# Patient Record
Sex: Female | Born: 1995 | Race: White | Hispanic: No | Marital: Married | Smoking: Never smoker
Health system: Southern US, Community
[De-identification: ages and names within clinical notes are randomized; demographics above are authoritative.]

## PROBLEM LIST (undated history)

## (undated) DIAGNOSIS — E119 Type 2 diabetes mellitus without complications: Secondary | ICD-10-CM

## (undated) DIAGNOSIS — B9689 Other specified bacterial agents as the cause of diseases classified elsewhere: Secondary | ICD-10-CM

## (undated) DIAGNOSIS — N200 Calculus of kidney: Secondary | ICD-10-CM

## (undated) HISTORY — PX: OTHER SURGICAL HISTORY: SHX169

---
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
## 2014-08-10 ENCOUNTER — Other Ambulatory Visit (HOSPITAL_COMMUNITY): Payer: Self-pay | Admitting: Family Medicine

## 2014-08-10 DIAGNOSIS — R1011 Right upper quadrant pain: Secondary | ICD-10-CM

## 2014-08-14 ENCOUNTER — Ambulatory Visit (HOSPITAL_COMMUNITY): Payer: BLUE CROSS/BLUE SHIELD

## 2014-08-18 ENCOUNTER — Other Ambulatory Visit (HOSPITAL_COMMUNITY): Payer: Self-pay | Admitting: Family Medicine

## 2014-08-18 ENCOUNTER — Ambulatory Visit (HOSPITAL_COMMUNITY)
Admission: RE | Admit: 2014-08-18 | Discharge: 2014-08-18 | Disposition: A | Discharge status: Home / Self Care | Payer: BLUE CROSS/BLUE SHIELD | Source: Ambulatory Visit | Attending: Family Medicine | Admitting: Family Medicine

## 2014-08-18 DIAGNOSIS — R1011 Right upper quadrant pain: Secondary | ICD-10-CM | POA: Diagnosis not present

## 2014-09-27 ENCOUNTER — Encounter (INDEPENDENT_AMBULATORY_CARE_PROVIDER_SITE_OTHER): Payer: Self-pay | Admitting: *Deleted

## 2014-10-16 ENCOUNTER — Ambulatory Visit (INDEPENDENT_AMBULATORY_CARE_PROVIDER_SITE_OTHER): Payer: BLUE CROSS/BLUE SHIELD | Admitting: Internal Medicine

## 2014-10-16 ENCOUNTER — Encounter (INDEPENDENT_AMBULATORY_CARE_PROVIDER_SITE_OTHER): Payer: Self-pay | Admitting: Internal Medicine

## 2014-10-16 VITALS — BP 130/80 | HR 72 | Temp 98.2°F | Ht 61.5 in | Wt 210.5 lb

## 2014-10-16 DIAGNOSIS — R1011 Right upper quadrant pain: Secondary | ICD-10-CM | POA: Diagnosis not present

## 2014-10-16 NOTE — Progress Notes (Signed)
Subjective:     Patient ID: Karen Padilla, female   DOB: 04/14/1996, 19 y.o.   MRN: [id number]  HPI Referred to our office by Dr. Lendell CapriceJunelle Padilla for abdominal pain. She has pain ruq pain since age 19. The pain comes and goes. The pain occurs after she eats. She has nausea and bloating. It occurs after a fatty meal.  She does have some heart burn.  She was evaluated by Dr. Malvin JohnsBradford in 2013 but was told she was to young for GB surgery.  Today there is no pain today.  She says she has not eaten. She has not  eaten any thing today.  Appetite for the most part is good.  She usually has a BM every day and sometimes she may skip a day. No melena or BRRB     08/18/2014 [facility] RUQ: abdominal pain RUQ: Normal exam.   Review of Systems No past medical history on file.  Past Surgical History  Procedure Laterality Date  . None      Allergies  Allergen Reactions  . Penicillins     No current outpatient prescriptions on file prior to visit.   No current facility-administered medications on file prior to visit.        Objective:   Physical Exam Blood pressure 130/80, pulse 72, temperature 98.2 F (36.8 C), height 5' 1.5" (1.562 m), weight 210 lb 8 oz (95.482 kg). Alert and oriented. Skin warm and dry. Oral mucosa is moist.   . Sclera anicteric, conjunctivae is pink. Thyroid not enlarged. No cervical lymphadenopathy. Lungs clear. Heart regular rate and rhythm.  Abdomen is soft. Bowel sounds are positive. No hepatomegaly. No abdominal masses felt. No tenderness.  No edema to lower extremities.    Assessment:     RUQ pain. ? GB disease needs to be ruled out.     Plan:     HIDA scan.  Further recommendations to follow.

## 2014-10-16 NOTE — Patient Instructions (Signed)
HIDA scan. Hepatic function.  

## 2014-10-17 LAB — HEPATIC FUNCTION PANEL
ALT: 13 U/L (ref 0–35)
AST: 11 U/L (ref 0–37)
Albumin: 4.1 g/dL (ref 3.5–5.2)
Alkaline Phosphatase: 68 U/L (ref 39–117)
BILIRUBIN DIRECT: 0.1 mg/dL (ref 0.0–0.3)
BILIRUBIN INDIRECT: 0.3 mg/dL (ref 0.2–1.1)
BILIRUBIN TOTAL: 0.4 mg/dL (ref 0.2–1.1)
Total Protein: 6.6 g/dL (ref 6.0–8.3)

## 2014-10-24 ENCOUNTER — Telehealth (INDEPENDENT_AMBULATORY_CARE_PROVIDER_SITE_OTHER): Payer: Self-pay | Admitting: *Deleted

## 2014-10-24 NOTE — Telephone Encounter (Signed)
Patient's insurance requires high-tech xrays be scheduled through [facility]. I spoke to U[place]ina with [facility] on 10/17/14 and gave her all information. She has left message for patient to call them to schedule. On 10/18/14 I left detailed message for patient and also spoke to patient's mother letting them know this and that they need to contact [facility] to get this scheduled. [facility] will contact me once appt has been scheduled. Spoke to SummitLaura @ [facility] to (5/10) and they still havent' heard from patient, they left 2 messages for patient to call them one on 5/3 & again on 5/6. No response from patient to [facility] or me

## 2014-10-24 NOTE — Telephone Encounter (Signed)
noted 

## 2015-05-03 ENCOUNTER — Encounter: Payer: Self-pay | Admitting: Women's Health

## 2015-05-04 ENCOUNTER — Encounter: Payer: Self-pay | Admitting: Obstetrics & Gynecology

## 2015-05-04 ENCOUNTER — Ambulatory Visit (INDEPENDENT_AMBULATORY_CARE_PROVIDER_SITE_OTHER): Payer: BLUE CROSS/BLUE SHIELD | Admitting: Obstetrics & Gynecology

## 2015-05-04 VITALS — BP 120/90 | HR 72 | Ht 61.2 in | Wt 213.0 lb

## 2015-05-04 DIAGNOSIS — Z3202 Encounter for pregnancy test, result negative: Secondary | ICD-10-CM

## 2015-05-04 DIAGNOSIS — N912 Amenorrhea, unspecified: Secondary | ICD-10-CM | POA: Diagnosis not present

## 2015-05-04 LAB — POCT URINE PREGNANCY: Preg Test, Ur: NEGATIVE

## 2015-05-04 MED ORDER — MEDROXYPROGESTERONE ACETATE 10 MG PO TABS: 10.0000 mg | ORAL_TABLET | Freq: Every day | ORAL | Status: DC

## 2015-05-04 NOTE — Progress Notes (Signed)
Patient ID: Karen Padilla, female   DOB: 12/07/1995, 19 y.o.   MRN: [id number]      Chief Complaint  Patient presents with  . new gyn visit    no period in 2 month.    Blood pressure 120/90, pulse 72, height 5' 1.2" (1.554 m), weight 213 lb (96.616 kg), last menstrual period 02/23/2015.  19 y.o. No obstetric history on file. Patient's last menstrual period was 02/23/2015. The current method of family planning is none.  Subjective Pt sometimes is late a few days to a week but never more than that Not on BCM, was on pills but made sick   Objective gen WDWN female NAD, no obvious PCOS except weight  Pertinent ROS No temperature issues or breast discharge noted  Labs or studies UPT negative    Impression Diagnoses this Encounter::   ICD-9-CM ICD-10-CM   1. Amenorrhea 626.0 N91.2   2. Negative pregnancy test V72.41 Z32.02 POCT urine pregnancy    Established relevant diagnosis(es):   Plan/Recommendations: Meds ordered this encounter  Medications  . FLUoxetine (PROZAC) 20 MG capsule    Sig: Take 20 mg by mouth daily.  . medroxyPROGESTERone (PROVERA) 10 MG tablet    Sig: Take 1 tablet (10 mg total) by mouth daily.    Dispense:  10 tablet    Refill:  0    Labs or Scans Ordered: Orders Placed This Encounter  Procedures  . POCT urine pregnancy    If becomes pattern then come back for a more extensive evlauation, for now assume not a pattern since it is the first time and no associated issues  Follow up       Face to face time:  15 minutes  Greater than 50% of the visit time was spent in counseling and coordination of care with the patient.  The summary and outline of the counseling and care coordination is summarized in the note above.   All questions were answered.  History reviewed. No pertinent past medical history.  Past Surgical History  Procedure Laterality Date  . None      OB History    No data available      Allergies  Allergen  Reactions  . Penicillins     Social History   Social History  . Marital Status: Single    Spouse Name: N/A  . Number of Children: N/A  . Years of Education: N/A   Social History Main Topics  . Smoking status: Never Smoker   . Smokeless tobacco: None  . Alcohol Use: No  . Drug Use: No  . Sexual Activity: Not Asked   Other Topics Concern  . None   Social History Narrative    History reviewed. No pertinent family history.

## 2016-03-20 IMAGING — US US ABDOMEN LIMITED
1 series · 14 of 25 positions shown · non-contrast
Comparison: 02/12/2012.

CLINICAL DATA: Abdominal pain.

EXAM:
US ABDOMEN LIMITED - RIGHT UPPER QUADRANT

[Series 1: us abdomen limited · 0.21mm/px · 14 of 51 slices shown]
[im 1/51]
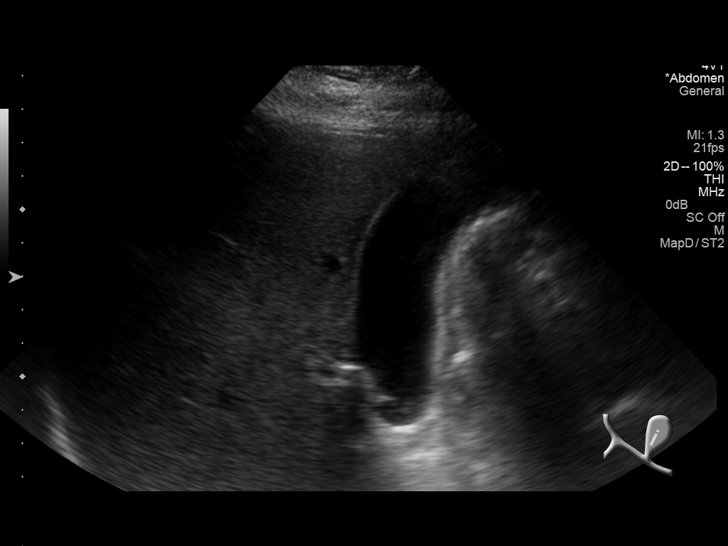
[im 5/51]
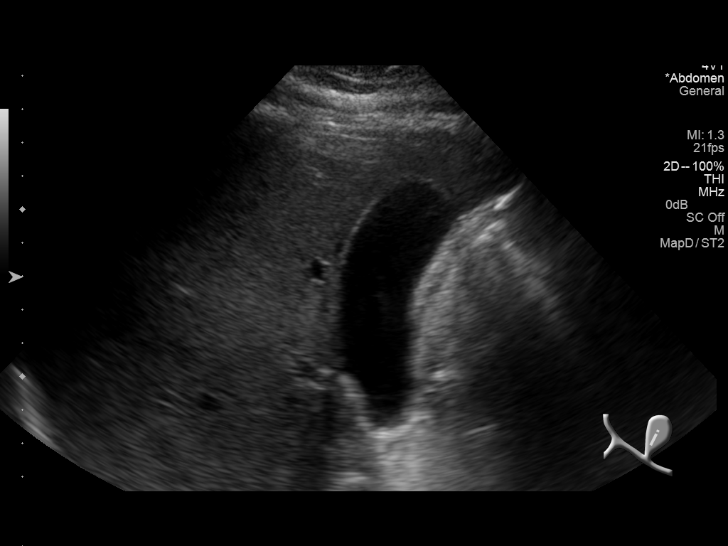
[im 9/51]
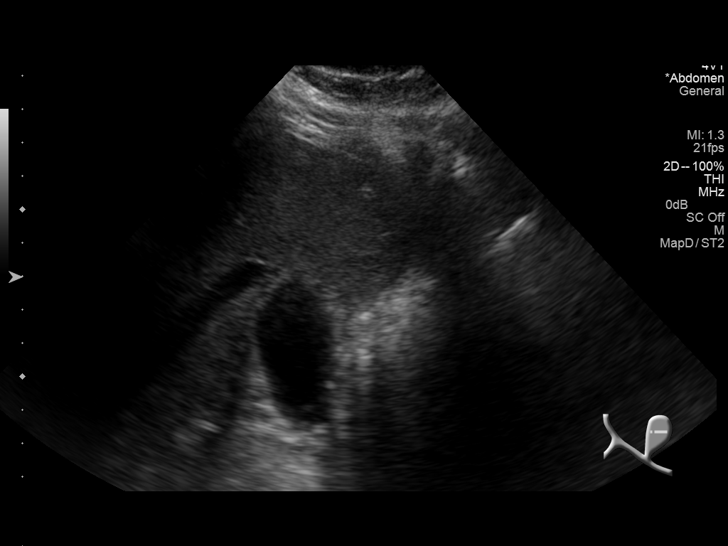
[im 13/51]
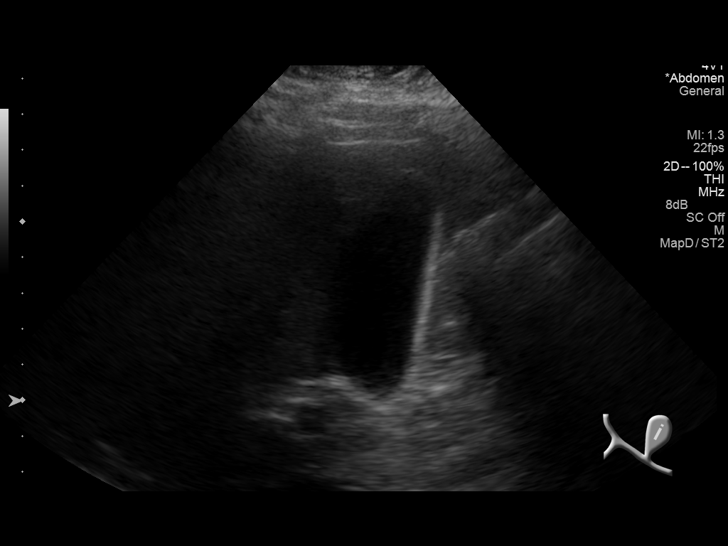
[im 17/51]
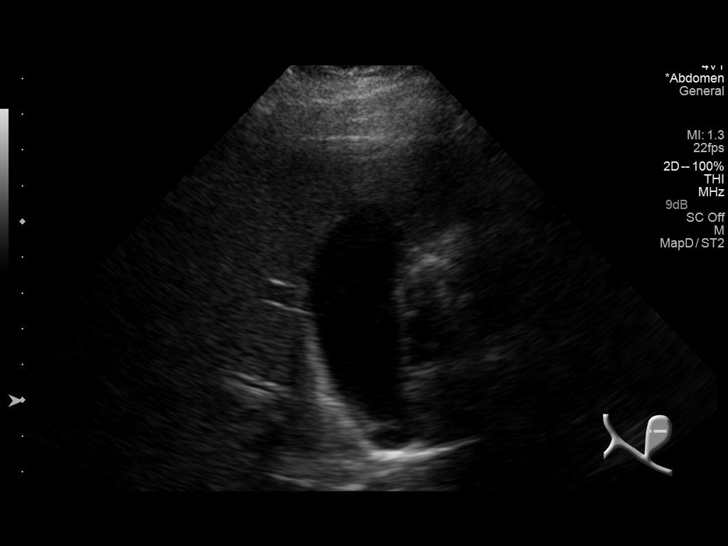
[im 19/51]
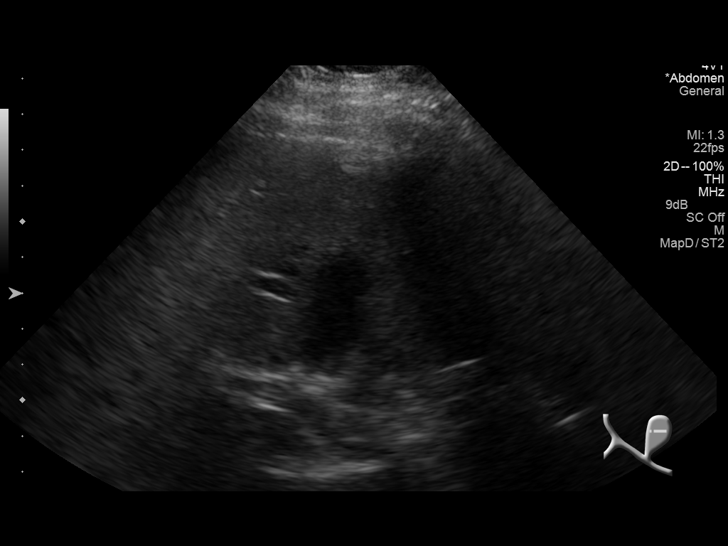
[im 23/51]
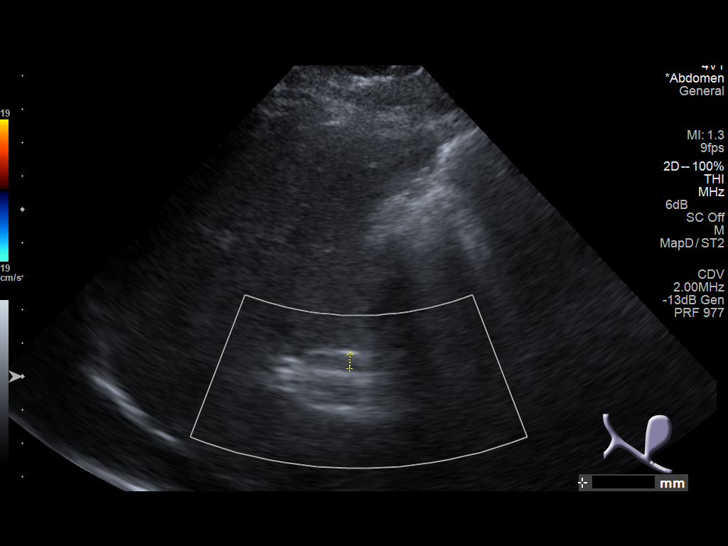
[im 28/51]
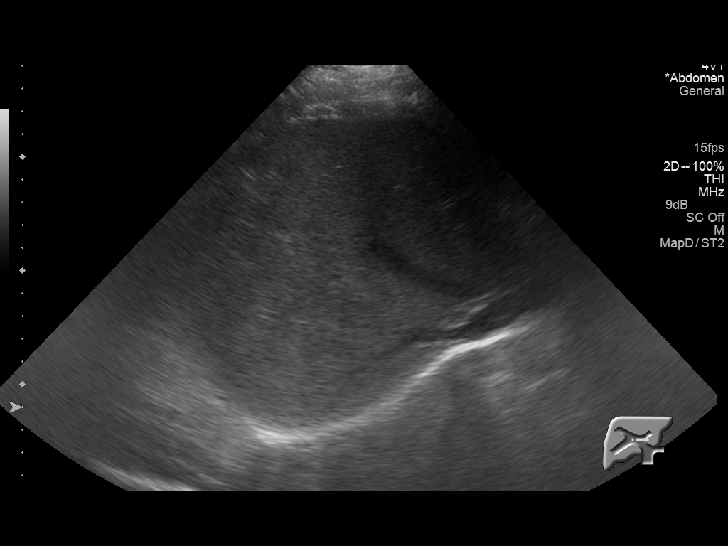
[im 32/51]
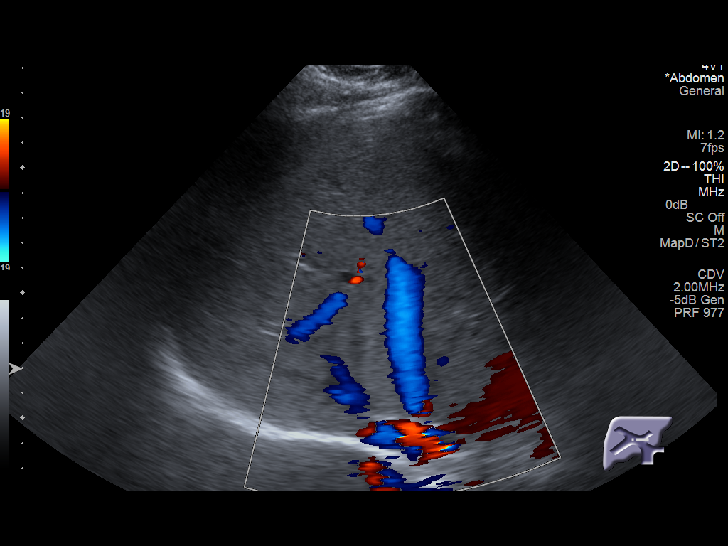
[im 34/51]
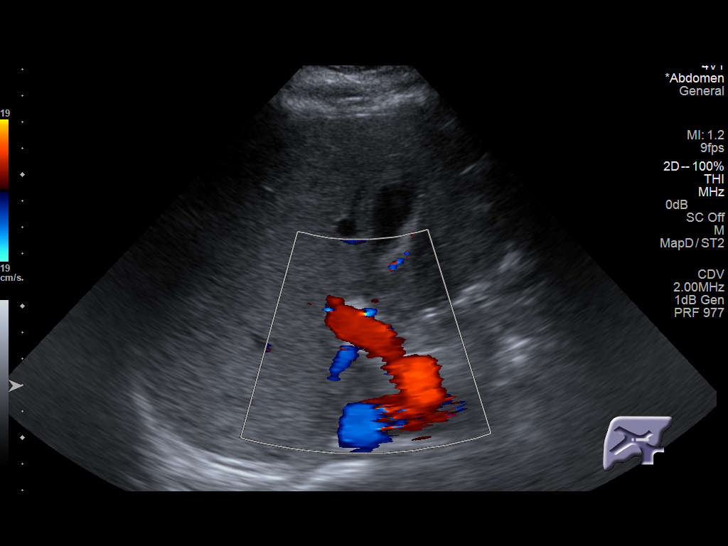
[im 38/51]
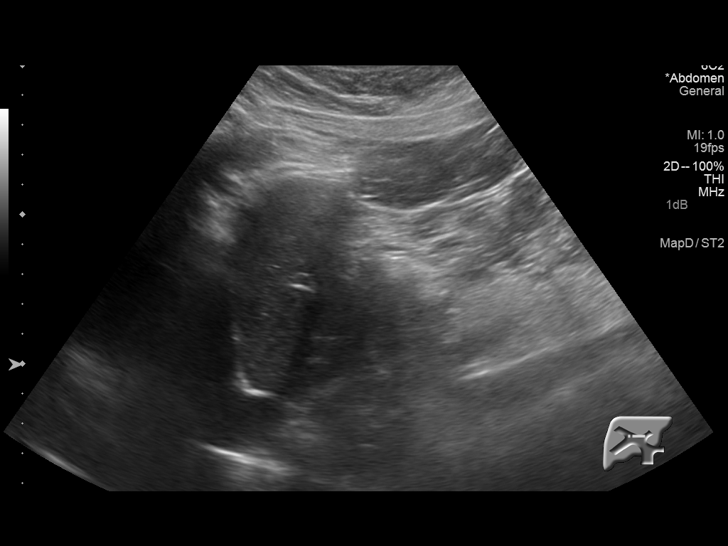
[im 42/51]
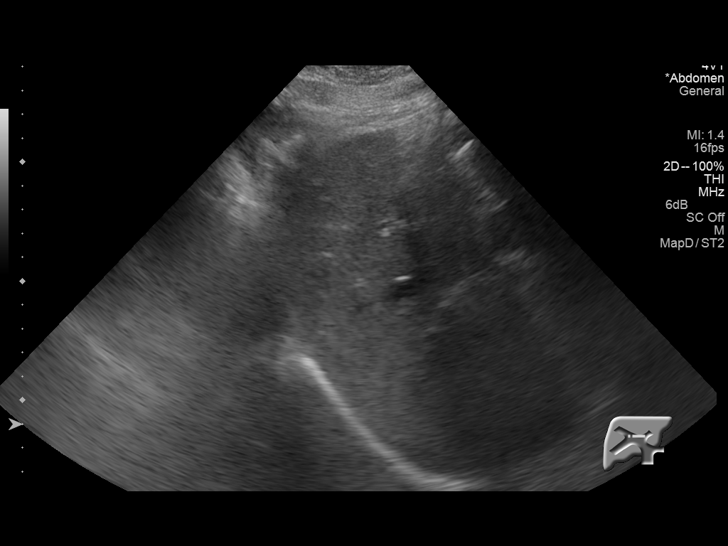
[im 46/51]
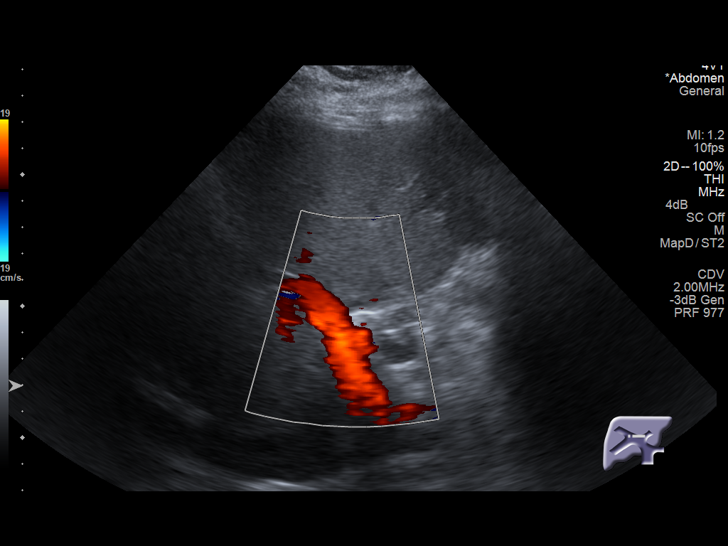
[im 51/51]
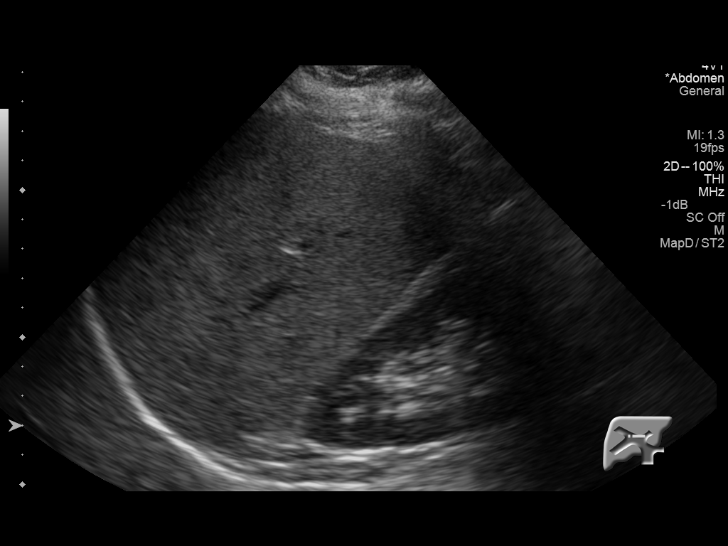

[14 of 25 positions shown; findings below may reference images not displayed]

FINDINGS: Gallbladder:

No gallstones or wall thickening visualized. No sonographic Murphy
sign noted.

Common bile duct:

Diameter: 4.3 mm

Liver:

No focal lesion identified. Within normal limits in parenchymal
echogenicity.
IMPRESSION: Normal exam.

## 2016-12-13 ENCOUNTER — Emergency Department (HOSPITAL_COMMUNITY)
Admission: EM | Admit: 2016-12-13 | Discharge: 2016-12-13 | Disposition: A | Discharge status: Home / Self Care | Payer: BLUE CROSS/BLUE SHIELD | Attending: Emergency Medicine | Admitting: Emergency Medicine

## 2016-12-13 ENCOUNTER — Encounter (HOSPITAL_COMMUNITY): Payer: Self-pay

## 2016-12-13 ENCOUNTER — Emergency Department (HOSPITAL_COMMUNITY): Payer: BLUE CROSS/BLUE SHIELD

## 2016-12-13 DIAGNOSIS — Z79899 Other long term (current) drug therapy: Secondary | ICD-10-CM | POA: Diagnosis not present

## 2016-12-13 DIAGNOSIS — N201 Calculus of ureter: Secondary | ICD-10-CM | POA: Diagnosis not present

## 2016-12-13 DIAGNOSIS — N23 Unspecified renal colic: Secondary | ICD-10-CM | POA: Diagnosis not present

## 2016-12-13 DIAGNOSIS — R1031 Right lower quadrant pain: Secondary | ICD-10-CM | POA: Diagnosis present

## 2016-12-13 HISTORY — DX: Calculus of kidney: N20.0

## 2016-12-13 LAB — URINALYSIS, ROUTINE W REFLEX MICROSCOPIC
BILIRUBIN URINE: NEGATIVE
Glucose, UA: NEGATIVE mg/dL
KETONES UR: 5 mg/dL — AB
Nitrite: NEGATIVE
PH: 5 (ref 5.0–8.0)
Protein, ur: 30 mg/dL — AB
SPECIFIC GRAVITY, URINE: 1.031 — AB (ref 1.005–1.030)

## 2016-12-13 LAB — PREGNANCY, URINE: PREG TEST UR: NEGATIVE

## 2016-12-13 MED ORDER — OXYCODONE-ACETAMINOPHEN 5-325 MG PO TABS
2.0000 | ORAL_TABLET | Freq: Once | ORAL | Status: AC
  Administered 2016-12-13: 2 via ORAL
  Filled 2016-12-13: qty 2

## 2016-12-13 MED ORDER — ONDANSETRON 8 MG PO TBDP
8.0000 mg | ORAL_TABLET | Freq: Once | ORAL | Status: AC
  Administered 2016-12-13: 8 mg via ORAL
  Filled 2016-12-13: qty 1

## 2016-12-13 MED ORDER — NAPROXEN 250 MG PO TABS: 250.0000 mg | ORAL_TABLET | Freq: Two times a day (BID) | ORAL | 0 refills | Status: DC

## 2016-12-13 MED ORDER — OXYCODONE-ACETAMINOPHEN 5-325 MG PO TABS: ORAL_TABLET | ORAL | 0 refills | Status: DC

## 2016-12-13 MED ORDER — TAMSULOSIN HCL 0.4 MG PO CAPS: 0.4000 mg | ORAL_CAPSULE | Freq: Every day | ORAL | 0 refills | Status: DC

## 2016-12-13 MED ORDER — ONDANSETRON HCL 4 MG PO TABS: 4.0000 mg | ORAL_TABLET | Freq: Three times a day (TID) | ORAL | 0 refills | Status: DC | PRN

## 2016-12-13 NOTE — Discharge Instructions (Signed)
Take the prescriptions as directed. Call the Urologist on Monday to schedule a follow up appointment this week.  Return to the Emergency Department immediately if worsening. ° °

## 2016-12-13 NOTE — ED Provider Notes (Signed)
[facility] Provider Note   CSN: [id number] Arrival date & time: 12/13/16  0820     History   Chief Complaint Chief Complaint  Patient presents with  . Flank Pain    HPI Karen Padilla is a 21 y.o. female.  HPI Pt was seen at 0845. Per pt, c/o sudden onset and persistence of waxing and waning right sided flank "pain" that began this morning at 0130.  Pt describes the pain as "sharp," "like my last kidney stone," and radiating into the right side of her abd.  Has been associated with one episode of N/V and seeing "pink" on the toilet paper after urinating.  Denies vaginal bleeding/discharge, no dysuria, no abd pain, no diarrhea, no black or blood in emesis, no CP/SOB, no fevers, no rash.    Past Medical History:  Diagnosis Date  . Kidney stones     There are no active problems to display for this patient.   Past Surgical History:  Procedure Laterality Date  . none      OB History    No data available       Home Medications    Prior to Admission medications   Medication Sig Start Date End Date Taking? Authorizing Provider  FLUoxetine (PROZAC) 20 MG capsule Take 20 mg by mouth daily.    [provider]  medroxyPROGESTERone (PROVERA) 10 MG tablet Take 1 tablet (10 mg total) by mouth daily. 05/04/15   Lazaro Arms, MD    Family History No family history on file.  Social History Social History  Substance Use Topics  . Smoking status: Never Smoker  . Smokeless tobacco: Never Used  . Alcohol use No     Allergies   Penicillins   Review of Systems Review of Systems ROS: Statement: All systems negative except as marked or noted in the HPI; Constitutional: Negative for fever and chills. ; ; Eyes: Negative for eye pain, redness and discharge. ; ; ENMT: Negative for ear pain, hoarseness, nasal congestion, sinus pressure and sore throat. ; ; Cardiovascular: Negative for chest pain, palpitations, diaphoresis, dyspnea and peripheral edema. ; ;  Respiratory: Negative for cough, wheezing and stridor. ; ; Gastrointestinal: +N/V. Negative for diarrhea, abdominal pain, blood in stool, hematemesis, jaundice and rectal bleeding. . ; ; Genitourinary: Negative for dysuria, +flank pain and hematuria. ; ; Musculoskeletal: Negative for back pain and neck pain. Negative for swelling and trauma.; ; Skin: Negative for pruritus, rash, abrasions, blisters, bruising and skin lesion.; ; Neuro: Negative for headache, lightheadedness and neck stiffness. Negative for weakness, altered level of consciousness, altered mental status, extremity weakness, paresthesias, involuntary movement, seizure and syncope.       Physical Exam Updated Vital Signs BP 139/82 (BP Location: Left Arm)   Pulse (!) 52   Temp 98.3 F (36.8 C) (Oral)   Resp 18   Ht 5\' 1"  (1.549 m)   Wt 96.6 kg (213 lb)   LMP 12/13/2016 Comment: STATES IT VARIES  SpO2 99%   BMI 40.25 kg/m   Physical Exam 0850: Physical examination:  Nursing notes reviewed; Vital signs and O2 SAT reviewed;  Constitutional: Well developed, Well nourished, Well hydrated, Uncomfortable appearing.; Head:  Normocephalic, atraumatic; Eyes: EOMI, PERRL, No scleral icterus; ENMT: Mouth and pharynx normal, Mucous membranes moist; Neck: Supple, Full range of motion, No lymphadenopathy; Cardiovascular: Regular rate and rhythm, No gallop; Respiratory: Breath sounds clear & equal bilaterally, No wheezes.  Speaking full sentences with ease, Normal respiratory effort/excursion; Chest: Nontender, Movement  normal; Abdomen: Soft, Nontender, Nondistended, Normal bowel sounds; Genitourinary: No CVA tenderness; Spine:  No midline CS, TS, LS tenderness.  +TTP right lumbar paraspinal muscles.;; Extremities: Pulses normal, No tenderness, No edema, No calf edema or asymmetry.; Neuro: AA&Ox3, Major CN grossly intact.  Speech clear. No gross focal motor or sensory deficits in extremities.; Skin: Color normal, Warm, Dry.   ED Treatments /  Results  Labs (all labs ordered are listed, but only abnormal results are displayed)   EKG  EKG Interpretation None       Radiology   Procedures Procedures (including critical care time)  Medications Ordered in ED Medications - No data to display   Initial Impression / Assessment and Plan / ED Course  I have reviewed the triage vital signs and the nursing notes.  Pertinent labs & imaging results that were available during my care of the patient were reviewed by me and considered in my medical decision making (see chart for details).  MDM Reviewed: previous chart, nursing note and vitals Interpretation: labs and CT scan    Results for orders placed or performed during the hospital encounter of 12/13/16  Urinalysis, Routine w reflex microscopic  Result Value Ref Range   Color, Urine YELLOW YELLOW   APPearance CLOUDY (A) CLEAR   Specific Gravity, Urine 1.031 (H) 1.005 - 1.030   pH 5.0 5.0 - 8.0   Glucose, UA NEGATIVE NEGATIVE mg/dL   Hgb urine dipstick LARGE (A) NEGATIVE   Bilirubin Urine NEGATIVE NEGATIVE   Ketones, ur 5 (A) NEGATIVE mg/dL   Protein, ur 30 (A) NEGATIVE mg/dL   Nitrite NEGATIVE NEGATIVE   Leukocytes, UA TRACE (A) NEGATIVE   RBC / HPF TOO NUMEROUS TO COUNT 0 - 5 RBC/hpf   WBC, UA 6-30 0 - 5 WBC/hpf   Bacteria, UA RARE (A) NONE SEEN   Squamous Epithelial / LPF 6-30 (A) NONE SEEN   Mucous PRESENT    Budding Yeast PRESENT   Pregnancy, urine  Result Value Ref Range   Preg Test, Ur NEGATIVE NEGATIVE   Ct Renal Stone Study Result Date: 12/13/2016 CLINICAL DATA:  Pt reports right flank pain that began approx 120 am. Pain is sharp. Vomited x 1. Noticed light pink vaginal discharge when she voided. Denies urinary symptoms EXAM: CT ABDOMEN AND PELVIS WITHOUT CONTRAST TECHNIQUE: Multidetector CT imaging of the abdomen and pelvis was performed following the standard protocol without IV contrast. COMPARISON:  None. FINDINGS: Lower chest: Clear lung bases.   Heart normal size. Hepatobiliary: Liver shows diffuse fatty infiltration. No liver mass or focal lesion. Normal gallbladder. No bile duct dilation. Pancreas: Unremarkable. No pancreatic ductal dilatation or surrounding inflammatory changes. Spleen: Normal in size without focal abnormality. Adrenals/Urinary Tract: No adrenal masses. There is mild right hydronephrosis. This is due to a 1-2 mm stone in the proximal right ureter. No left hydronephrosis. No renal masses. No intrarenal stones. Left renal collecting system ureter are unremarkable. Bladder is decompressed but otherwise unremarkable. Stomach/Bowel: Stomach is within normal limits. Appendix appears normal. No evidence of bowel wall thickening, distention, or inflammatory changes. Vascular/Lymphatic: No significant vascular findings are present. No enlarged abdominal or pelvic lymph nodes. Reproductive: Uterus and bilateral adnexa are unremarkable. Other: No abdominal wall hernia or abnormality. No abdominopelvic ascites. Musculoskeletal: No acute or significant osseous findings. IMPRESSION: 1. 1-2 mm stone in the proximal right ureter causes mild right hydronephrosis. 2. No other acute abnormalities. 3. Hepatic steatosis. Electronically Signed   By: Amie Portlandavid  Ormond M.D.   On:  12/13/2016 09:29    1010:  Pt feels better after meds and wants to go home now. Has tol PO well while in the ED without N/V. Tx symptomatically, f/u Uro MD. Dx and testing d/w pt and family.  Questions answered.  Verb understanding, agreeable to d/c home with outpt f/u.   Final Clinical Impressions(s) / ED Diagnoses   Final diagnoses:  None    New Prescriptions New Prescriptions   No medications on file     Samuel Jester, DO 12/21/16 1046

## 2016-12-13 NOTE — ED Triage Notes (Addendum)
Pt reports right flank pain that began approx 120 am. Pain is sharp. Vomited x 1. Noticed light pink vaginal discharge when she voided. Denies urinary symptoms

## 2016-12-18 ENCOUNTER — Encounter (HOSPITAL_COMMUNITY): Payer: Self-pay

## 2016-12-18 ENCOUNTER — Emergency Department (HOSPITAL_COMMUNITY)
Admission: EM | Admit: 2016-12-18 | Discharge: 2016-12-19 | Disposition: A | Discharge status: Home / Self Care | Payer: BLUE CROSS/BLUE SHIELD | Attending: Emergency Medicine | Admitting: Emergency Medicine

## 2016-12-18 ENCOUNTER — Emergency Department (HOSPITAL_COMMUNITY): Payer: BLUE CROSS/BLUE SHIELD

## 2016-12-18 DIAGNOSIS — N23 Unspecified renal colic: Secondary | ICD-10-CM | POA: Diagnosis not present

## 2016-12-18 DIAGNOSIS — R1011 Right upper quadrant pain: Secondary | ICD-10-CM | POA: Diagnosis present

## 2016-12-18 LAB — URINALYSIS, ROUTINE W REFLEX MICROSCOPIC
Bacteria, UA: NONE SEEN
Bilirubin Urine: NEGATIVE
GLUCOSE, UA: NEGATIVE mg/dL
Ketones, ur: NEGATIVE mg/dL
Leukocytes, UA: NEGATIVE
NITRITE: NEGATIVE
PH: 7 (ref 5.0–8.0)
Protein, ur: NEGATIVE mg/dL
SPECIFIC GRAVITY, URINE: 1.019 (ref 1.005–1.030)

## 2016-12-18 LAB — PREGNANCY, URINE: Preg Test, Ur: NEGATIVE

## 2016-12-18 MED ORDER — HYDROMORPHONE HCL 1 MG/ML IJ SOLN
1.0000 mg | Freq: Once | INTRAMUSCULAR | Status: AC
  Administered 2016-12-19: 1 mg via INTRAVENOUS
  Filled 2016-12-18: qty 1

## 2016-12-18 MED ORDER — KETOROLAC TROMETHAMINE 30 MG/ML IJ SOLN
30.0000 mg | Freq: Once | INTRAMUSCULAR | Status: AC
  Administered 2016-12-19: 30 mg via INTRAVENOUS
  Filled 2016-12-18: qty 1

## 2016-12-18 MED ORDER — ONDANSETRON HCL 4 MG/2ML IJ SOLN
4.0000 mg | Freq: Once | INTRAMUSCULAR | Status: AC
  Administered 2016-12-19: 4 mg via INTRAVENOUS
  Filled 2016-12-18: qty 2

## 2016-12-18 MED ORDER — SODIUM CHLORIDE 0.9 % IV BOLUS (SEPSIS)
1000.0000 mL | Freq: Once | INTRAVENOUS | Status: AC
  Administered 2016-12-19: 1000 mL via INTRAVENOUS

## 2016-12-18 MED ORDER — ONDANSETRON HCL 4 MG/2ML IJ SOLN
4.0000 mg | Freq: Once | INTRAMUSCULAR | Status: DC
  Filled 2016-12-18: qty 2

## 2016-12-18 NOTE — ED Triage Notes (Signed)
Pt reports that she was seen on Saturday for kidney stone, then felt ok for 2 days. Then pain returned last night in right flank area . Pt reports that she hasnt been able to keep anything down

## 2016-12-18 NOTE — ED Provider Notes (Signed)
[facility] Provider Note   CSN: [id number] Arrival date & time: 12/18/16  1719     History   Chief Complaint Chief Complaint  Patient presents with  . Flank Pain    HPI Fredirick MaudlinJulia M Hodges is a 21 y.o. female.  Patient with right-sided flank pain since this morning. She was diagnosed with a right-sided kidney stone in June 30. She states she felt well for about 2 days and the pain returned this morning. States she's not been able to keep anything down. Denies fever, chills. Denies diarrhea. Denies chest pain or shortness of breath. Denies focal weakness, numbness or tingling.  she states she's had too much nausea and vomiting to count today. She has not yet followed up with urology.   The history is provided by the patient.  Flank Pain  Pertinent negatives include no chest pain, no abdominal pain, no headaches and no shortness of breath.    Past Medical History:  Diagnosis Date  . Kidney stones   . Kidney stones     There are no active problems to display for this patient.   Past Surgical History:  Procedure Laterality Date  . none      OB History    No data available       Home Medications    Prior to Admission medications   Medication Sig Start Date End Date Taking? Authorizing Provider  ibuprofen (ADVIL,MOTRIN) 200 MG tablet Take 200-600 mg by mouth every 6 (six) hours as needed.    [provider]  naproxen (NAPROSYN) 250 MG tablet Take 1 tablet (250 mg total) by mouth 2 (two) times daily with a meal. 12/13/16   Samuel JesterMcManus, Kathleen, DO  ondansetron (ZOFRAN) 4 MG tablet Take 1 tablet (4 mg total) by mouth every 8 (eight) hours as needed for nausea or vomiting. 12/13/16   Samuel JesterMcManus, Kathleen, DO  oxyCODONE-acetaminophen (PERCOCET/ROXICET) 5-325 MG tablet 1 or 2 tabs PO q6h prn pain 12/13/16   Samuel JesterMcManus, Kathleen, DO  tamsulosin (FLOMAX) 0.4 MG CAPS capsule Take 1 capsule (0.4 mg total) by mouth at bedtime. 12/13/16   Samuel JesterMcManus, Kathleen, DO    Family  History No family history on file.  Social History Social History  Substance Use Topics  . Smoking status: Never Smoker  . Smokeless tobacco: Never Used  . Alcohol use No     Allergies   Penicillins   Review of Systems Review of Systems  Constitutional: Positive for activity change. Negative for fatigue.  HENT: Negative for congestion and rhinorrhea.   Respiratory: Negative for cough, chest tightness and shortness of breath.   Cardiovascular: Negative for chest pain.  Gastrointestinal: Positive for nausea and vomiting. Negative for abdominal pain.  Genitourinary: Positive for dysuria and flank pain. Negative for hematuria, vaginal bleeding and vaginal discharge.  Musculoskeletal: Positive for back pain. Negative for arthralgias, myalgias and neck pain.  Neurological: Negative for dizziness, weakness, light-headedness and headaches.   all other systems are negative except as noted in the HPI and PMH.    Physical Exam Updated Vital Signs BP (!) 153/102   Pulse 82   Temp 98.3 F (36.8 C)   Resp 20   Ht 5\' 1"  (1.549 m)   Wt 96.6 kg (213 lb)   LMP 12/13/2016 Comment: STATES IT VARIES  SpO2 98%   BMI 40.25 kg/m   Physical Exam  Constitutional: She is oriented to person, place, and time. She appears well-developed and well-nourished. No distress.  HENT:  Head: Normocephalic and atraumatic.  Mouth/Throat: Oropharynx is clear and moist. No oropharyngeal exudate.  Eyes: Conjunctivae and EOM are normal. Pupils are equal, round, and reactive to light.  Neck: Normal range of motion. Neck supple.  No meningismus.  Cardiovascular: Normal rate, regular rhythm, normal heart sounds and intact distal pulses.   No murmur heard. Pulmonary/Chest: Effort normal and breath sounds normal. No respiratory distress.  Abdominal: Soft. There is tenderness. There is no rebound and no guarding.  soft  Genitourinary:  Genitourinary Comments: R CVAT  Musculoskeletal: Normal range of motion.  She exhibits no edema or tenderness.  Neurological: She is alert and oriented to person, place, and time. No cranial nerve deficit. She exhibits normal muscle tone. Coordination normal.   5/5 strength throughout. CN 2-12 intact.Equal grip strength.   Skin: Skin is warm.  Psychiatric: She has a normal mood and affect. Her behavior is normal.  Nursing note and vitals reviewed.    ED Treatments / Results  Labs (all labs ordered are listed, but only abnormal results are displayed) Labs Reviewed  URINALYSIS, ROUTINE W REFLEX MICROSCOPIC - Abnormal; Notable for the following:       Result Value   Hgb urine dipstick MODERATE (*)    Squamous Epithelial / LPF 0-5 (*)    All other components within normal limits  CBC WITH DIFFERENTIAL/PLATELET - Abnormal; Notable for the following:    WBC 18.8 (*)    Neutro Abs 14.3 (*)    All other components within normal limits  URINE CULTURE  PREGNANCY, URINE  COMPREHENSIVE METABOLIC PANEL  LIPASE, BLOOD    EKG  EKG Interpretation None       Radiology Dg Abdomen 1 View  Result Date: 12/18/2016 CLINICAL DATA:  Right flank pain. EXAM: ABDOMEN - 1 VIEW COMPARISON:  CT 5 days prior 12/13/2016 FINDINGS: The tiny 1-2 mm stone in the right ureter on prior exam is not visualized. No radiopaque calculi seen. Normal bowel gas pattern. Small stool burden. No acute osseous abnormality. IMPRESSION: The tiny 1-2 mm stone in the right ureter on prior CT is not seen. This may have passed or is just not seen radiographically due to small size. Electronically Signed   By: Rubye Oaks M.D.   On: 12/18/2016 23:49   Ct Abdomen Pelvis W Contrast  Result Date: 12/19/2016 CLINICAL DATA:  Acute onset of right flank pain.  Initial encounter. EXAM: CT ABDOMEN AND PELVIS WITH CONTRAST TECHNIQUE: Multidetector CT imaging of the abdomen and pelvis was performed using the standard protocol following bolus administration of intravenous contrast. CONTRAST:  ISOVUE-300  IOPAMIDOL (ISOVUE-300) INJECTION 61% COMPARISON:  CT of the abdomen and pelvis performed 12/13/2016 FINDINGS: Lower chest: The visualized lung bases are grossly clear. The visualized portions of the mediastinum are unremarkable. Hepatobiliary: The liver is unremarkable in appearance. The gallbladder is unremarkable in appearance. The common bile duct remains normal in caliber. Pancreas: The pancreas is within normal limits. Spleen: The spleen is unremarkable in appearance. Adrenals/Urinary Tract: The adrenal glands are unremarkable in appearance. A 3 mm stone is noted within the distal right ureter, just above the right vesicoureteral junction. There is minimal associated right-sided hydronephrosis, with enlargement of the right kidney. There is mild wall thickening at the distal right ureter, raising question for right-sided ureteritis. The left kidney is unremarkable in appearance. No nonobstructing renal stones are identified. No perinephric stranding is appreciated. Stomach/Bowel: The stomach is unremarkable in appearance. The small bowel is within normal limits. The appendix is normal in caliber, without evidence  of appendicitis. The colon is unremarkable in appearance. Vascular/Lymphatic: The abdominal aorta is unremarkable in appearance. The inferior vena cava is grossly unremarkable. No retroperitoneal lymphadenopathy is seen. No pelvic sidewall lymphadenopathy is identified. Reproductive: The bladder is mildly distended and grossly unremarkable. The uterus is unremarkable in appearance. The ovaries are relatively symmetric. No suspicious adnexal masses are seen. Other: No additional soft tissue abnormalities are seen. Musculoskeletal: No acute osseous abnormalities are identified. The visualized musculature is unremarkable in appearance. IMPRESSION: 1. Minimal right-sided hydronephrosis, with enlargement of the right kidney. 3 mm obstructing stone at the distal right ureter, just above the right  vesicoureteral junction. 2. Mild wall thickening at the distal right ureter raises question for underlying right-sided ureteritis. Electronically Signed   By: Roanna Raider M.D.   On: 12/19/2016 01:49    Procedures Procedures (including critical care time)  Medications Ordered in ED Medications  sodium chloride 0.9 % bolus 1,000 mL (not administered)  ondansetron (ZOFRAN) injection 4 mg (not administered)  HYDROmorphone (DILAUDID) injection 1 mg (not administered)  ondansetron (ZOFRAN) injection 4 mg (not administered)  ketorolac (TORADOL) 30 MG/ML injection 30 mg (not administered)     Initial Impression / Assessment and Plan / ED Course  I have reviewed the triage vital signs and the nursing notes.  Pertinent labs & imaging results that were available during my care of the patient were reviewed by me and considered in my medical decision making (see chart for details).    Patient with recurrent right flank pain after being diagnosed with kidney stone on June 30. No fever. Multiple episodes of vomiting today.  Right flank pain on exam. Urinalysis with blood without infection. Culture will be sent. Patient given IV fluids and symptom control. Labs show significant leukocytosis of 18.8. CT scan will be checked again to evaluate for other pathology given this high leukocytosis. Patient is afebrile and well-appearing.  Appendix and gallbladder are normal. Right-sided ureteral stone has progressed to the right UVJ.  Patient feels improved in the ED and is not vomiting. Continue treatment for right-sided kidney stone. We'll refill her Zofran. Prophylactic Keflex will be given given thickening of the ureter. Patient remains afebrile and her urinalysis is not consistent with infection.  Follow-up with urology. Return precautions discussed including fever or worsening pain.  Final Clinical Impressions(s) / ED Diagnoses   Final diagnoses:  Ureteral colic    New Prescriptions New  Prescriptions   No medications on file     Glynn Octave, MD 12/19/16 [phone]

## 2016-12-19 ENCOUNTER — Emergency Department (HOSPITAL_COMMUNITY): Payer: BLUE CROSS/BLUE SHIELD

## 2016-12-19 LAB — CBC WITH DIFFERENTIAL/PLATELET
Basophils Absolute: 0 10*3/uL (ref 0.0–0.1)
Basophils Relative: 0 %
EOS PCT: 1 %
Eosinophils Absolute: 0.1 10*3/uL (ref 0.0–0.7)
HCT: 39.2 % (ref 36.0–46.0)
HEMOGLOBIN: 13.2 g/dL (ref 12.0–15.0)
LYMPHS ABS: 3.4 10*3/uL (ref 0.7–4.0)
LYMPHS PCT: 18 %
MCH: 29.6 pg (ref 26.0–34.0)
MCHC: 33.7 g/dL (ref 30.0–36.0)
MCV: 87.9 fL (ref 78.0–100.0)
MONOS PCT: 5 %
Monocytes Absolute: 1 10*3/uL (ref 0.1–1.0)
Neutro Abs: 14.3 10*3/uL — ABNORMAL HIGH (ref 1.7–7.7)
Neutrophils Relative %: 76 %
PLATELETS: 327 10*3/uL (ref 150–400)
RBC: 4.46 MIL/uL (ref 3.87–5.11)
RDW: 12.1 % (ref 11.5–15.5)
WBC: 18.8 10*3/uL — AB (ref 4.0–10.5)

## 2016-12-19 LAB — COMPREHENSIVE METABOLIC PANEL
ALK PHOS: 90 U/L (ref 38–126)
ALT: 28 U/L (ref 14–54)
AST: 17 U/L (ref 15–41)
Albumin: 4 g/dL (ref 3.5–5.0)
Anion gap: 9 (ref 5–15)
BUN: 13 mg/dL (ref 6–20)
CALCIUM: 9.2 mg/dL (ref 8.9–10.3)
CO2: 27 mmol/L (ref 22–32)
CREATININE: 0.92 mg/dL (ref 0.44–1.00)
Chloride: 104 mmol/L (ref 101–111)
Glucose, Bld: 88 mg/dL (ref 65–99)
Potassium: 3.6 mmol/L (ref 3.5–5.1)
Sodium: 140 mmol/L (ref 135–145)
Total Bilirubin: 0.5 mg/dL (ref 0.3–1.2)
Total Protein: 7.2 g/dL (ref 6.5–8.1)

## 2016-12-19 LAB — LIPASE, BLOOD: LIPASE: 18 U/L (ref 11–51)

## 2016-12-19 MED ORDER — IOPAMIDOL (ISOVUE-300) INJECTION 61%
100.0000 mL | Freq: Once | INTRAVENOUS | Status: AC | PRN
  Administered 2016-12-19: 100 mL via INTRAVENOUS

## 2016-12-19 MED ORDER — ONDANSETRON 4 MG PO TBDP: 4.0000 mg | ORAL_TABLET | Freq: Three times a day (TID) | ORAL | 0 refills | Status: DC | PRN

## 2016-12-19 MED ORDER — CEPHALEXIN 500 MG PO CAPS: 500.0000 mg | ORAL_CAPSULE | Freq: Four times a day (QID) | ORAL | 0 refills | Status: DC

## 2016-12-19 NOTE — ED Notes (Signed)
Pt given water to drink per fluid challenge 

## 2016-12-19 NOTE — Discharge Instructions (Signed)
Continue the pain medication and nausea medication as prescribed. Return to the ED if you develop worsening pain, fever, vomiting or any other concerns.

## 2016-12-19 NOTE — ED Notes (Signed)
Pt ambulatory to waiting room. Pt verbalized understanding of discharge instructions.   

## 2016-12-20 LAB — URINE CULTURE

## 2016-12-21 ENCOUNTER — Telehealth: Payer: Self-pay

## 2016-12-21 NOTE — Telephone Encounter (Signed)
No further treatment needed for UC ED 12/19/16 per Margarito CourserAllison Masters Pharm D

## 2018-07-21 IMAGING — DX DG ABDOMEN 1V
2 series · 2 of 2 positions shown · non-contrast
Comparison: CT 5 days prior 12/13/2016

CLINICAL DATA: Right flank pain.

EXAM:
ABDOMEN - 1 VIEW

[abdomen kub (1 of 2)]
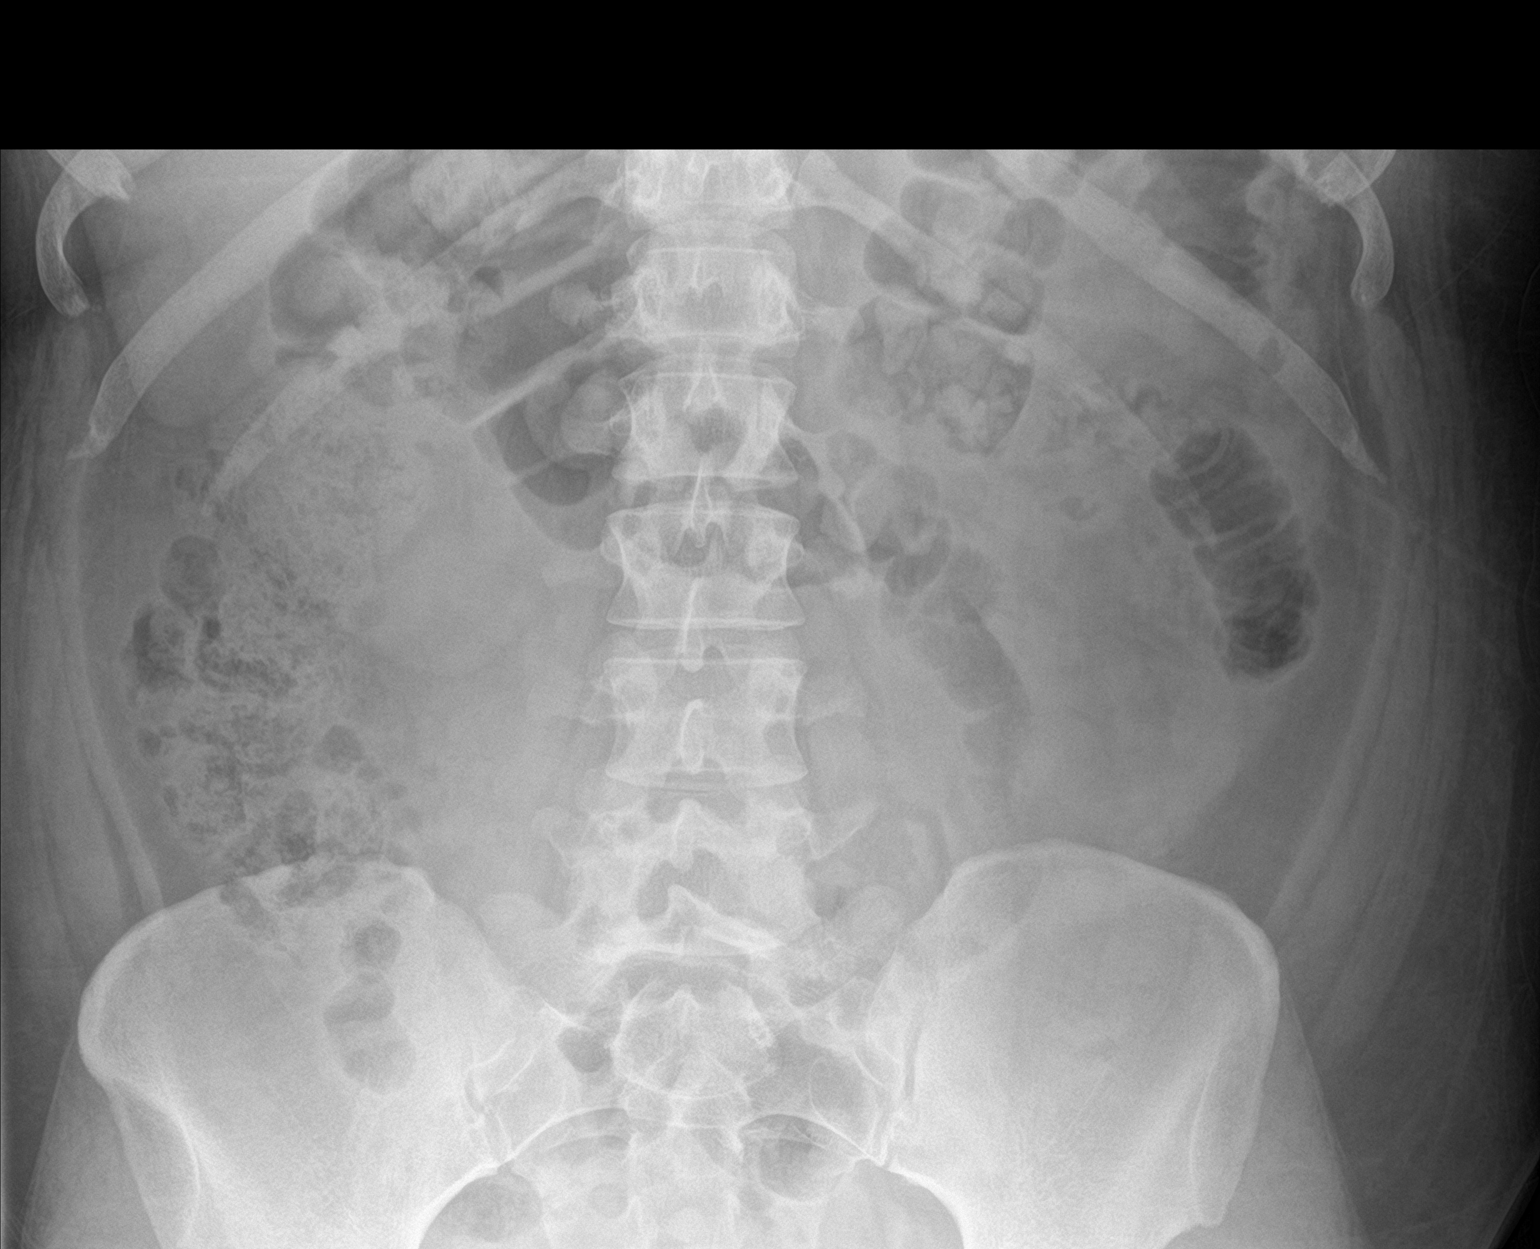

[abdomen kub (2 of 2)]
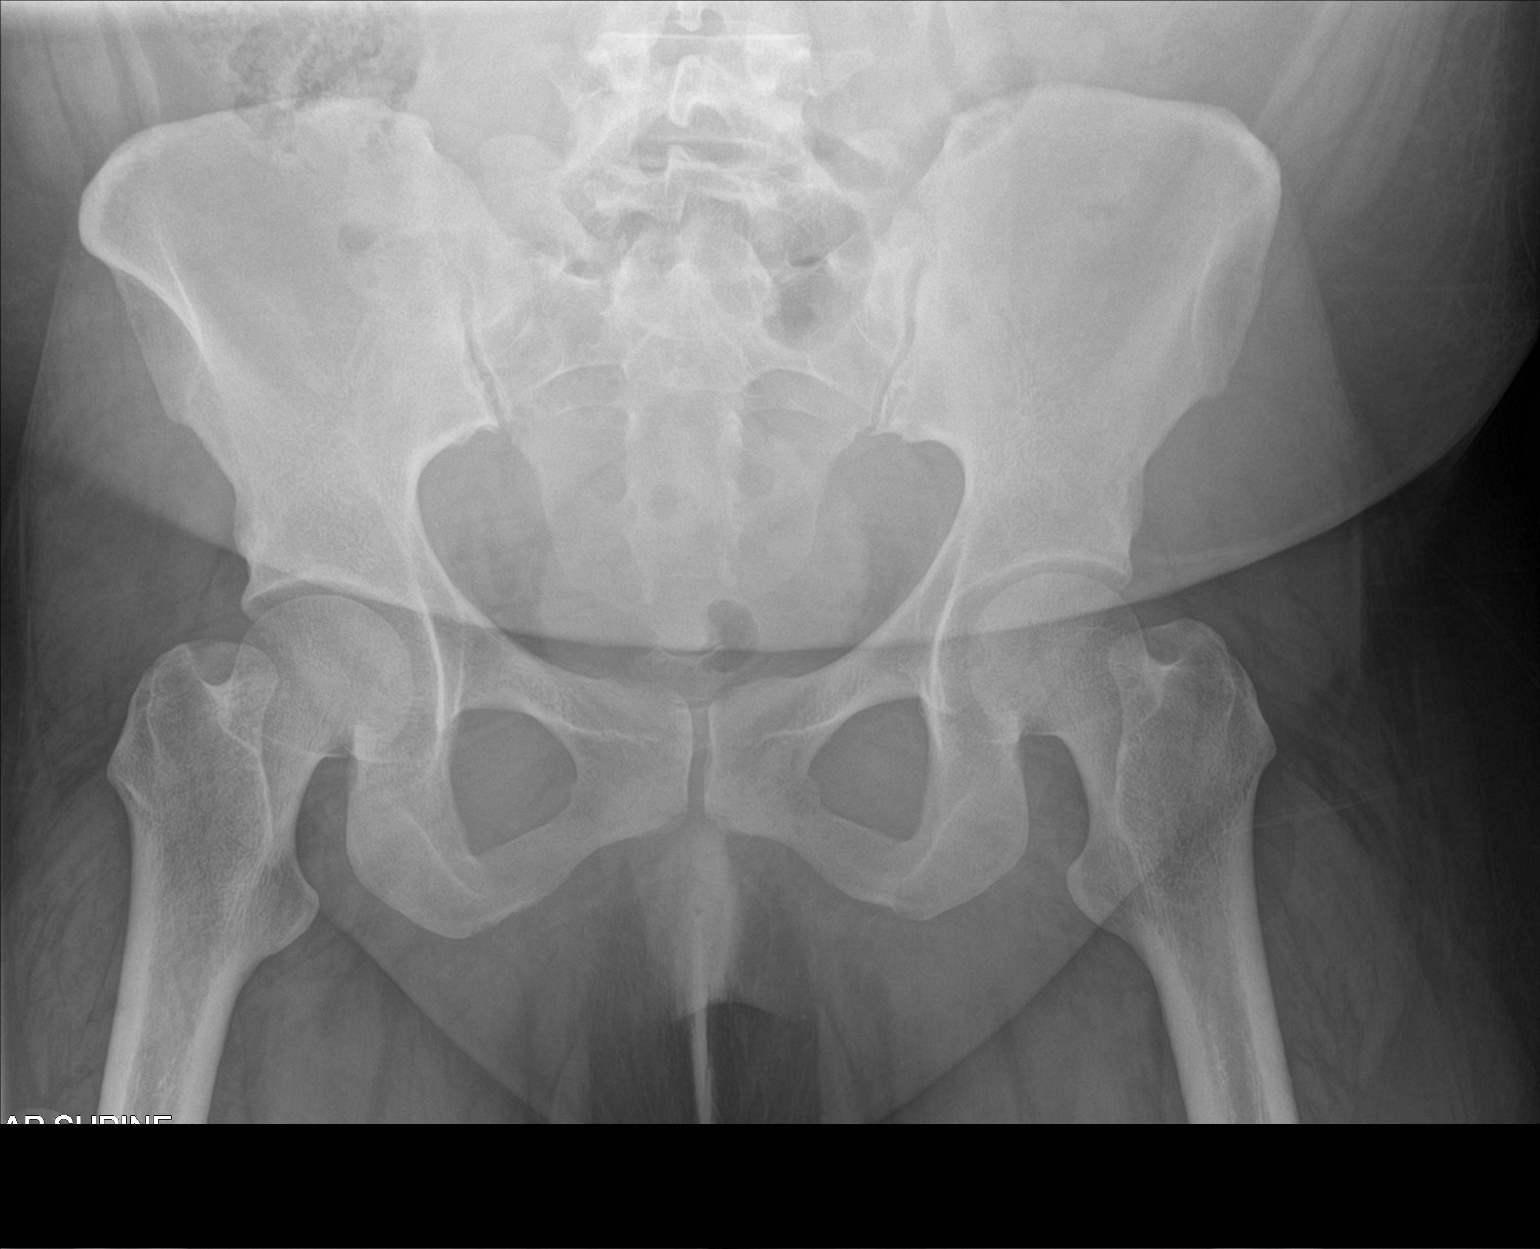

[2 of 2 positions shown; findings below may reference images not displayed]

FINDINGS: The tiny 1-2 mm stone in the right ureter on prior exam is not
visualized. No radiopaque calculi seen. Normal bowel gas pattern.
Small stool burden. No acute osseous abnormality.
IMPRESSION: The tiny 1-2 mm stone in the right ureter on prior CT is not seen.
This may have passed or is just not seen radiographically due to
small size.

## 2019-05-06 ENCOUNTER — Emergency Department (HOSPITAL_COMMUNITY)
Admission: EM | Admit: 2019-05-06 | Discharge: 2019-05-06 | Disposition: A | Discharge status: Home / Self Care | Payer: Self-pay | Attending: Emergency Medicine | Admitting: Emergency Medicine

## 2019-05-06 ENCOUNTER — Encounter (HOSPITAL_COMMUNITY): Payer: Self-pay

## 2019-05-06 ENCOUNTER — Emergency Department (HOSPITAL_COMMUNITY): Payer: Self-pay

## 2019-05-06 ENCOUNTER — Other Ambulatory Visit: Payer: Self-pay

## 2019-05-06 DIAGNOSIS — N200 Calculus of kidney: Secondary | ICD-10-CM

## 2019-05-06 DIAGNOSIS — Z791 Long term (current) use of non-steroidal anti-inflammatories (NSAID): Secondary | ICD-10-CM | POA: Insufficient documentation

## 2019-05-06 DIAGNOSIS — R109 Unspecified abdominal pain: Secondary | ICD-10-CM

## 2019-05-06 DIAGNOSIS — R739 Hyperglycemia, unspecified: Secondary | ICD-10-CM

## 2019-05-06 DIAGNOSIS — N939 Abnormal uterine and vaginal bleeding, unspecified: Secondary | ICD-10-CM

## 2019-05-06 DIAGNOSIS — Z79899 Other long term (current) drug therapy: Secondary | ICD-10-CM | POA: Insufficient documentation

## 2019-05-06 HISTORY — DX: Other specified bacterial agents as the cause of diseases classified elsewhere: B96.89

## 2019-05-06 LAB — BASIC METABOLIC PANEL
Anion gap: 11 (ref 5–15)
BUN: 12 mg/dL (ref 6–20)
CO2: 23 mmol/L (ref 22–32)
Calcium: 9.1 mg/dL (ref 8.9–10.3)
Chloride: 103 mmol/L (ref 98–111)
Creatinine, Ser: 0.92 mg/dL (ref 0.44–1.00)
GFR calc Af Amer: >60 mL/min (ref >60)
GFR calc non Af Amer: >60 mL/min (ref >60)
Glucose, Bld: 220 mg/dL — ABNORMAL HIGH (ref 70–99)
Potassium: 3.5 mmol/L (ref 3.5–5.1)
Sodium: 137 mmol/L (ref 135–145)

## 2019-05-06 LAB — CBC WITH DIFFERENTIAL/PLATELET
Abs Immature Granulocytes: 0.12 10*3/uL — ABNORMAL HIGH (ref 0.00–0.07)
Basophils Absolute: 0.1 10*3/uL (ref 0.0–0.1)
Basophils Relative: 0 %
Eosinophils Absolute: 0.2 10*3/uL (ref 0.0–0.5)
Eosinophils Relative: 1 %
HCT: 42.6 % (ref 36.0–46.0)
Hemoglobin: 14.1 g/dL (ref 12.0–15.0)
Immature Granulocytes: 1 %
Lymphocytes Relative: 17 %
Lymphs Abs: 3.2 10*3/uL (ref 0.7–4.0)
MCH: 29.6 pg (ref 26.0–34.0)
MCHC: 33.1 g/dL (ref 30.0–36.0)
MCV: 89.3 fL (ref 80.0–100.0)
Monocytes Absolute: 1.2 10*3/uL — ABNORMAL HIGH (ref 0.1–1.0)
Monocytes Relative: 6 %
Neutro Abs: 14.3 10*3/uL — ABNORMAL HIGH (ref 1.7–7.7)
Neutrophils Relative %: 75 %
Platelets: 392 10*3/uL (ref 150–400)
RBC: 4.77 MIL/uL (ref 3.87–5.11)
RDW: 11.8 % (ref 11.5–15.5)
WBC: 19 10*3/uL — ABNORMAL HIGH (ref 4.0–10.5)
nRBC: 0 % (ref 0.0–0.2)

## 2019-05-06 LAB — URINALYSIS, ROUTINE W REFLEX MICROSCOPIC
Bacteria, UA: NONE SEEN
Bilirubin Urine: NEGATIVE
Glucose, UA: NEGATIVE mg/dL
Ketones, ur: NEGATIVE mg/dL
Leukocytes,Ua: NEGATIVE
Nitrite: NEGATIVE
Protein, ur: NEGATIVE mg/dL
RBC / HPF: >50 RBC/hpf — ABNORMAL HIGH (ref 0–5)
Specific Gravity, Urine: 1.018 (ref 1.005–1.030)
pH: 7 (ref 5.0–8.0)

## 2019-05-06 LAB — PREGNANCY, URINE: Preg Test, Ur: NEGATIVE

## 2019-05-06 MED ORDER — OXYCODONE-ACETAMINOPHEN 5-325 MG PO TABS: 1.0000 | ORAL_TABLET | Freq: Four times a day (QID) | ORAL | 0 refills | Status: DC | PRN

## 2019-05-06 MED ORDER — TAMSULOSIN HCL 0.4 MG PO CAPS: 0.4000 mg | ORAL_CAPSULE | Freq: Every day | ORAL | 0 refills | Status: DC

## 2019-05-06 MED ORDER — ONDANSETRON 4 MG PO TBDP: ORAL_TABLET | ORAL | 0 refills | Status: DC

## 2019-05-06 MED ORDER — PROMETHAZINE HCL 25 MG/ML IJ SOLN
12.5000 mg | Freq: Once | INTRAMUSCULAR | Status: AC
  Administered 2019-05-06: 12.5 mg via INTRAVENOUS
  Filled 2019-05-06: qty 1

## 2019-05-06 MED ORDER — KETOROLAC TROMETHAMINE 30 MG/ML IJ SOLN
30.0000 mg | Freq: Once | INTRAMUSCULAR | Status: AC
  Administered 2019-05-06: 30 mg via INTRAVENOUS
  Filled 2019-05-06: qty 1

## 2019-05-06 MED ORDER — ONDANSETRON HCL 4 MG/2ML IJ SOLN
4.0000 mg | Freq: Once | INTRAMUSCULAR | Status: AC
  Administered 2019-05-06: 4 mg via INTRAVENOUS
  Filled 2019-05-06: qty 2

## 2019-05-06 MED ORDER — SODIUM CHLORIDE 0.9 % IV BOLUS (SEPSIS)
1000.0000 mL | Freq: Once | INTRAVENOUS | Status: AC
  Administered 2019-05-06: 1000 mL via INTRAVENOUS

## 2019-05-06 MED ORDER — FENTANYL CITRATE (PF) 100 MCG/2ML IJ SOLN
100.0000 ug | Freq: Once | INTRAMUSCULAR | Status: AC
  Administered 2019-05-06: 100 ug via INTRAVENOUS
  Filled 2019-05-06: qty 2

## 2019-05-06 NOTE — ED Provider Notes (Signed)
[facility] EMERGENCY DEPARTMENT Provider Note   CSN: [id number] Arrival date & time: 05/06/19  6301     History   Chief Complaint Chief Complaint  Patient presents with  . Flank Pain    left    HPI Karen Padilla is a 23 y.o. female.     The history is provided by the patient.  Flank Pain This is a new problem. The current episode started yesterday. The problem occurs constantly. The problem has been gradually worsening. Associated symptoms include abdominal pain. Pertinent negatives include no chest pain. Nothing aggravates the symptoms. Nothing relieves the symptoms.   Patient reports she is been having vaginal bleeding for up to 2 years.  She reports it occurs just about every day.  She was seen at the hospital in ED for this and was diagnosed with BV.  She reports her bleeding has never stopped since that treatment. She reports she began having left flank pain yesterday.  It wraps around into her abdomen and into her vagina Reports urinary frequency. Past Medical History:  Diagnosis Date  . BV (bacterial vaginosis)   . Kidney stones   . Kidney stones     There are no active problems to display for this patient.   Past Surgical History:  Procedure Laterality Date  . none       OB History   No obstetric history on file.      Home Medications    Prior to Admission medications   Medication Sig Start Date End Date Taking? Authorizing Provider  cephALEXin (KEFLEX) 500 MG capsule Take 1 capsule (500 mg total) by mouth 4 (four) times daily. 12/19/16   Rancour, Annie Main, MD  ibuprofen (ADVIL,MOTRIN) 200 MG tablet Take 200-600 mg by mouth every 6 (six) hours as needed.    [provider]  naproxen (NAPROSYN) 250 MG tablet Take 1 tablet (250 mg total) by mouth 2 (two) times daily with a meal. 12/13/16   Francine Graven, DO  ondansetron (ZOFRAN ODT) 4 MG disintegrating tablet Take 1 tablet (4 mg total) by mouth every 8 (eight) hours as needed for nausea or  vomiting. 12/19/16   Rancour, Annie Main, MD  ondansetron (ZOFRAN) 4 MG tablet Take 1 tablet (4 mg total) by mouth every 8 (eight) hours as needed for nausea or vomiting. 12/13/16   Francine Graven, DO  oxyCODONE-acetaminophen (PERCOCET/ROXICET) 5-325 MG tablet 1 or 2 tabs PO q6h prn pain 12/13/16   Francine Graven, DO  tamsulosin (FLOMAX) 0.4 MG CAPS capsule Take 1 capsule (0.4 mg total) by mouth at bedtime. 12/13/16   Francine Graven, DO    Family History History reviewed. No pertinent family history.  Social History Social History   Tobacco Use  . Smoking status: Never Smoker  . Smokeless tobacco: Never Used  Substance Use Topics  . Alcohol use: No    Alcohol/week: 0.0 standard drinks  . Drug use: No     Allergies   Penicillins   Review of Systems Review of Systems  Cardiovascular: Negative for chest pain.  Gastrointestinal: Positive for abdominal pain.  Genitourinary: Positive for flank pain and vaginal bleeding.  All other systems reviewed and are negative.    Physical Exam Updated Vital Signs BP (!) 160/98 (BP Location: Left Arm)   Pulse 85   Temp 98.5 F (36.9 C) (Oral)   Resp 18   Ht 1.549 m (5\' 1" )   Wt 97 kg   LMP  (LMP Unknown)   SpO2 98%   BMI  40.41 kg/m   Physical Exam CONSTITUTIONAL: Well developed/well nourished, uncomfortable. HEAD: Normocephalic/atraumatic EYES: EOMI/PERRL ENMT: Mucous membranes moist NECK: supple no meningeal signs SPINE/BACK:entire spine nontender CV: S1/S2 noted, no murmurs/rubs/gallops noted LUNGS: Lungs are clear to auscultation bilaterally, no apparent distress ABDOMEN: soft, nontender, no rebound or guarding, bowel sounds noted throughout abdomen GU: Left cva tenderness No CMT.  No vaginal discharge.  No vaginal lesions or abscesses noted.  Small amount of blood noted at cervical os.  No active bleeding noted Nurse Dorathy Daft present for entire exam NEURO: Pt is awake/alert/appropriate, moves all extremitiesx4.  No facial  droop.   EXTREMITIES: pulses normal/equal, full ROM SKIN: warm, color normal PSYCH: Anxious   ED Treatments / Results  Labs (all labs ordered are listed, but only abnormal results are displayed) Labs Reviewed  URINALYSIS, ROUTINE W REFLEX MICROSCOPIC - Abnormal; Notable for the following components:      Result Value   APPearance HAZY (*)    Hgb urine dipstick LARGE (*)    RBC / HPF >50 (*)    All other components within normal limits  BASIC METABOLIC PANEL - Abnormal; Notable for the following components:   Glucose, Bld 220 (*)    All other components within normal limits  CBC WITH DIFFERENTIAL/PLATELET - Abnormal; Notable for the following components:   WBC 19.0 (*)    Neutro Abs 14.3 (*)    Monocytes Absolute 1.2 (*)    Abs Immature Granulocytes 0.12 (*)    All other components within normal limits  PREGNANCY, URINE    EKG None  Radiology No results found.  Procedures Procedures    Medications Ordered in ED Medications  promethazine (PHENERGAN) injection 12.5 mg (has no administration in time range)  sodium chloride 0.9 % bolus 1,000 mL (has no administration in time range)  fentaNYL (SUBLIMAZE) injection 100 mcg (100 mcg Intravenous Given 05/06/19 0609)  ketorolac (TORADOL) 30 MG/ML injection 30 mg (30 mg Intravenous Given 05/06/19 0609)  ondansetron (ZOFRAN) injection 4 mg (4 mg Intravenous Given 05/06/19 0650)     Initial Impression / Assessment and Plan / ED Course  I have reviewed the triage vital signs and the nursing notes.  Pertinent labs & imaging results that were available during my care of the patient were reviewed by me and considered in my medical decision making (see chart for details).        6:00 AM Patient with known history of kidney stones presents with left flank pain.  She also reports vaginal bleeding for at least 2 years.  She will need follow-up with gynecology 6:47 AM Patient reports pain improved, but is having nausea.  No focal  abdominal tenderness. Reports continued left flank pain 7:15 AM Strong suspicion this is a ureteral stone.  Patient reports continued pain and nausea.  We will start with KUB. Signed out to dr zammit with imaging pending Pt also noted to have hyperglycemia, will need followup Final Clinical Impressions(s) / ED Diagnoses   Final diagnoses:  Left flank pain  Vaginal bleeding    ED Discharge Orders    None       Zadie Rhine, MD 05/06/19 [phone]

## 2019-05-06 NOTE — ED Triage Notes (Addendum)
Pt states that she has been having uterine bleeding for 2 years straight. Has not been to OBGYN.  Went to [facility] a while back with a dx of BV. Took med course but did not help bleeding.

## 2019-05-06 NOTE — Discharge Instructions (Addendum)
Follow up with [facility] next week °

## 2019-05-06 NOTE — ED Triage Notes (Signed)
Pt c/o of left flank pain that started yesterday. Karen Padilla that this happens often with her menstrual cycles but not often this painful

## 2019-05-06 NOTE — ED Notes (Signed)
Called lab to check on lab work. Said that they have not started running the samples yet will run now.

## 2019-11-22 ENCOUNTER — Telehealth: Payer: Self-pay | Admitting: Advanced Practice Midwife

## 2019-11-22 NOTE — Telephone Encounter (Signed)
Attempted to call patient to review covid restrictions and health questions voicemail box not available.

## 2019-11-23 ENCOUNTER — Ambulatory Visit (INDEPENDENT_AMBULATORY_CARE_PROVIDER_SITE_OTHER): Payer: Medicaid Other | Admitting: Advanced Practice Midwife

## 2019-11-23 ENCOUNTER — Other Ambulatory Visit (HOSPITAL_COMMUNITY)
Admission: RE | Admit: 2019-11-23 | Discharge: 2019-11-23 | Disposition: A | Discharge status: Home / Self Care | Payer: 59 | Source: Ambulatory Visit | Attending: Advanced Practice Midwife | Admitting: Advanced Practice Midwife

## 2019-11-23 ENCOUNTER — Other Ambulatory Visit: Payer: Self-pay

## 2019-11-23 ENCOUNTER — Encounter: Payer: Self-pay | Admitting: Advanced Practice Midwife

## 2019-11-23 ENCOUNTER — Ambulatory Visit: Payer: Self-pay | Admitting: Advanced Practice Midwife

## 2019-11-23 VITALS — BP 156/109 | Ht 61.5 in | Wt 220.0 lb

## 2019-11-23 DIAGNOSIS — Z3202 Encounter for pregnancy test, result negative: Secondary | ICD-10-CM | POA: Diagnosis not present

## 2019-11-23 DIAGNOSIS — Z124 Encounter for screening for malignant neoplasm of cervix: Secondary | ICD-10-CM | POA: Insufficient documentation

## 2019-11-23 DIAGNOSIS — Z01419 Encounter for gynecological examination (general) (routine) without abnormal findings: Secondary | ICD-10-CM

## 2019-11-23 DIAGNOSIS — I10 Essential (primary) hypertension: Secondary | ICD-10-CM | POA: Insufficient documentation

## 2019-11-23 DIAGNOSIS — N939 Abnormal uterine and vaginal bleeding, unspecified: Secondary | ICD-10-CM | POA: Insufficient documentation

## 2019-11-23 LAB — POCT URINE PREGNANCY: Preg Test, Ur: NEGATIVE

## 2019-11-23 LAB — POCT HEMOGLOBIN: Hemoglobin: 14.2 g/dL (ref 11–14.6)

## 2019-11-23 NOTE — Progress Notes (Signed)
Karen Padilla EXAMINATION Patient name: Karen Padilla MRN [id number]  Date of birth: [birth year] Chief Complaint:   Menometrorrhagia  History of Present Illness:   Karen Padilla is a 24 y.o. G0P0000 Caucasian female being seen today for a routine well-woman exam.  Current complaints: vag bleeding since March 2019; sometimes spotting, at other times she requires a Depends diaper; prior to onset of bleedindg has always had irreg cycles; the last time she used contraception was in 2017 (DMPA); has never had a Pap smear; mother with Type 2 DM; was a previous pt of Dr Hilma Favors but hasn't been in awhile  Depression screen PHQ 2/9 11/23/2019  Decreased Interest 1  Down, Depressed, Hopeless 1  PHQ - 2 Score 2  Altered sleeping 2  Tired, decreased energy 2  Change in appetite 2  Feeling bad or failure about yourself  2  Trouble concentrating 1  Moving slowly or fidgety/restless 0  Suicidal thoughts 0  PHQ-9 Score 11  Difficult doing work/chores Not difficult at all     PCP: Dr Hilma Favors (in the past, but now has insurance)      does desire labs No LMP recorded. (Menstrual status: Irregular Periods). The current method of family planning is none.  Last pap never.  Last mammogram: never.  Family h/o breast cancer: no Last colonoscopy: never.  Family h/o colorectal cancer: yes , mother Review of Systems:   Pertinent items are noted in HPI Denies any headaches, blurred vision, fatigue, shortness of breath, chest pain, abdominal pain, abnormal vaginal discharge/itching/odor/irritation, problems with periods, bowel movements, urination, or intercourse unless otherwise stated above. Pertinent History Reviewed:  Reviewed past medical,surgical, social and family history.  Reviewed problem list, medications and allergies. Physical Assessment:   Vitals:   11/23/19 1353  BP: (!) 156/109  Weight: 220 lb (99.8 kg)  Height: 5' 1.5" (1.562 m)  Body mass index is 40.9 kg/m.        Physical  Examination:   General appearance - well appearing, and in no distress  Mental status - alert, oriented to person, place, and time  Psych:  She has a normal mood and affect  Skin - warm and dry, normal color, no suspicious lesions noted  Chest - effort normal, all lung fields clear to auscultation bilaterally  Heart - normal rate and regular rhythm  Neck:  midline trachea, no thyromegaly or nodules  Abdomen - soft, nontender, nondistended, no masses or organomegaly  Pelvic - VULVA: normal appearing vulva with no masses, tenderness or lesions  VAGINA: normal appearing vagina with normal color and discharge, no lesions  CERVIX: normal appearing cervix without discharge or lesions, no CMT  Thin prep pap is done without HR HPV cotesting  UTERUS: uterus is felt to be normal size, shape, consistency and nontender   ADNEXA: No adnexal masses or tenderness noted.  Extremities:  No swelling or varicosities noted  Chaperone: Rolena Infante    Results for orders placed or performed in visit on 11/23/19 (from the past 24 hour(s))  POCT urine pregnancy   Collection Time: 11/23/19  1:56 PM  Result Value Ref Range   Preg Test, Ur Negative Negative  POCT hemoglobin   Collection Time: 11/23/19  1:57 PM  Result Value Ref Range   Hemoglobin 14.2 11 - 14.6 g/dL    Assessment & Plan:  1) Well-Woman Exam  2) AUB> constant vag bleeding (ranging from spotting to requiring a diaper) since March 2019  3) Chronic hypertension, at ED visits Nov  2020 and July 2018 had BPs 160/98 and 153/102 respectively; rec make first avail appt with Dr Hilma Favors  4) Mod elevated PHQ-9 score, will review at next visit   Labs/procedures today: preg test, CBC, CMET, Hgb A1c, TSH, testosterone, Pap, GC/chlam  Mammogram age 41 or sooner if problems Colonoscopy 44yr prior to mom's dx or sooner if problems  Orders Placed This Encounter  Procedures  . [place] Complete  . CBC  . Comp Met (CMET)  . TSH  . HgB A1c  .  Testosterone  . Testosterone, free  . Testosterone, % free  . Sex hormone binding globulin  . POCT urine pregnancy  . POCT hemoglobin    Meds: No orders of the defined types were placed in this encounter.   Follow-up: Return for 1st available, [place] GYN w/ visit (JG/LHE/JVF).  KMyrtis SerCNM 11/23/2019 2:38 PM

## 2019-11-26 NOTE — Progress Notes (Signed)
Can you please inform her that she has diabetes, based on her random blood sugar. For some reason her Hgb A1c was cancelled. She was instructed to make an appt with her PCP, Dr Phillips Odor, for her BP but she also needs care/teaching for DM. She has a pelvic u/s on 6/24 with a f/u provider appt- by then her complete PCOS labs wil be resulted and a plan can be reviewed with her.

## 2019-11-28 LAB — CYTOLOGY - PAP
Chlamydia: NEGATIVE
Comment: NEGATIVE
Comment: NEGATIVE
Comment: NORMAL
Diagnosis: NEGATIVE
High risk HPV: NEGATIVE
Neisseria Gonorrhea: NEGATIVE

## 2019-11-29 LAB — TESTOSTERONE, % FREE: Testosterone-% Free: 2.3 %

## 2019-11-29 LAB — COMPREHENSIVE METABOLIC PANEL
ALT: 46 IU/L — ABNORMAL HIGH (ref 0–32)
AST: 30 IU/L (ref 0–40)
Albumin/Globulin Ratio: 1.7 (ref 1.2–2.2)
Albumin: 4.4 g/dL (ref 3.9–5.0)
Alkaline Phosphatase: 103 IU/L (ref 48–121)
BUN/Creatinine Ratio: 15 (ref 9–23)
BUN: 8 mg/dL (ref 6–20)
Bilirubin Total: 0.3 mg/dL (ref 0.0–1.2)
CO2: 22 mmol/L (ref 20–29)
Calcium: 10 mg/dL (ref 8.7–10.2)
Chloride: 99 mmol/L (ref 96–106)
Creatinine, Ser: 0.52 mg/dL — ABNORMAL LOW (ref 0.57–1.00)
GFR calc Af Amer: 155 mL/min/{1.73_m2} (ref >59)
GFR calc non Af Amer: 134 mL/min/{1.73_m2} (ref >59)
Globulin, Total: 2.6 g/dL (ref 1.5–4.5)
Glucose: 267 mg/dL — ABNORMAL HIGH (ref 65–99)
Potassium: 4.1 mmol/L (ref 3.5–5.2)
Sodium: 138 mmol/L (ref 134–144)
Total Protein: 7 g/dL (ref 6.0–8.5)

## 2019-11-29 LAB — HEMOGLOBIN A1C

## 2019-11-29 LAB — TSH: TSH: 1.4 u[IU]/mL (ref 0.450–4.500)

## 2019-11-29 LAB — TESTOSTERONE: Testosterone: 39 ng/dL (ref 13–71)

## 2019-11-29 LAB — SEX HORMONE BINDING GLOBULIN: Sex Hormone Binding: 16.1 nmol/L — ABNORMAL LOW (ref 24.6–122.0)

## 2019-11-29 LAB — TESTOSTERONE, FREE: Testosterone, Free: 2.2 pg/mL (ref 0.0–4.2)

## 2019-11-30 ENCOUNTER — Telehealth: Payer: Self-pay | Admitting: *Deleted

## 2019-11-30 NOTE — Telephone Encounter (Signed)
Patient informed she has diabetes based on her random blood sugar. States she has not made an appt with her PCP, Dr Phillips Odor, for her BP. Advised to give them a call to get all of this looked at. Patient did not want me to send in referral for diabetes education and management.  Stated she would give PCP a call.

## 2019-12-05 ENCOUNTER — Other Ambulatory Visit: Payer: Self-pay | Admitting: Advanced Practice Midwife

## 2019-12-05 DIAGNOSIS — N926 Irregular menstruation, unspecified: Secondary | ICD-10-CM

## 2019-12-08 ENCOUNTER — Ambulatory Visit (INDEPENDENT_AMBULATORY_CARE_PROVIDER_SITE_OTHER): Payer: 59 | Admitting: Obstetrics & Gynecology

## 2019-12-08 ENCOUNTER — Other Ambulatory Visit: Payer: Self-pay

## 2019-12-08 ENCOUNTER — Ambulatory Visit (INDEPENDENT_AMBULATORY_CARE_PROVIDER_SITE_OTHER): Payer: 59

## 2019-12-08 ENCOUNTER — Encounter: Payer: Self-pay | Admitting: Obstetrics & Gynecology

## 2019-12-08 VITALS — BP 128/88 | HR 66 | Ht 61.0 in | Wt 219.0 lb

## 2019-12-08 DIAGNOSIS — N939 Abnormal uterine and vaginal bleeding, unspecified: Secondary | ICD-10-CM

## 2019-12-08 DIAGNOSIS — E119 Type 2 diabetes mellitus without complications: Secondary | ICD-10-CM

## 2019-12-08 DIAGNOSIS — N926 Irregular menstruation, unspecified: Secondary | ICD-10-CM | POA: Diagnosis not present

## 2019-12-08 DIAGNOSIS — E282 Polycystic ovarian syndrome: Secondary | ICD-10-CM

## 2019-12-08 MED ORDER — METFORMIN HCL 500 MG PO TABS: 500.0000 mg | ORAL_TABLET | Freq: Two times a day (BID) | ORAL | 2 refills | Status: AC

## 2019-12-08 MED ORDER — MEGESTROL ACETATE 40 MG PO TABS: ORAL_TABLET | ORAL | 3 refills | Status: AC

## 2019-12-08 NOTE — Progress Notes (Signed)
PELVIC US TA/TV: homogeneous anteverted uterus,WNL,EEC 13 mm,enlarged ovaries,right ovary has mult small peripheral follicles,limited view of left ovary,ovaries appear mobile,mult simple nabothian cysts,no free fluid,no pain during ultrasound

## 2019-12-08 NOTE — Progress Notes (Signed)
Patient ID: Karen Padilla, female   DOB: 07-28-1995, 24 y.o.   MRN: [id number] Follow up appointment for results  Chief Complaint  Patient presents with  . discuss [place] results    Blood pressure 128/88, pulse 66, height 5\' 1"  (1.549 m), weight 219 lb (99.3 kg).  GYNECOLOGIC SONOGRAM   Karen Padilla is a 24 y.o. G0P0000 No LMP recorded. (Menstrual status: Irregular Periods). She is here for a pelvic sonogram for abnormal uterine bleeding.  Uterus                      8.2 x 3.9 x 5.4 cm, Total uterine volume 91 cc,homogeneous anteverted uterus,WNL  Endometrium          13 mm, symmetrical, wnl  Right ovary             3.8 x 2.8 x 3.8 cm, vol 21 ml,enlarged with mult small peripheral follicles  Left ovary                3.9 x 2.6 x 4.1 cm, vol 22 ml,enlarged,limited view   No free fluid   Technician Comments:  PELVIC US TA/TV: homogeneous anteverted uterus,WNL,EEC 13 mm,enlarged ovaries,right ovary has mult small peripheral follicles,limited view of left ovary,ovaries appear mobile,mult simple nabothian cysts,no free fluid,no pain during ultrasound   Chaperone [address] Karen Padilla 12/08/2019 12:01 PM  Clinical Impression and recommendations:  I have reviewed the sonogram results above, combined with the patient's current clinical course, below are my impressions and any appropriate recommendations for management based on the sonographic findings.  Uterus is normal size shape and contour Endometrium is normal for a menstruating woman Both ovaries have the morphology and size of chronic anovulation or oligo ovulation fruently seen in a PCOS type clinical picture   Karen Padilla 12/08/2019 12:24 PM    MEDS ordered this encounter: Meds ordered this encounter  Medications  . metFORMIN (GLUCOPHAGE) 500 MG tablet    Sig: Take 1 tablet (500 mg total) by mouth 2 (two) times daily with a meal.    Dispense:  60 tablet    Refill:  2  . megestrol (MEGACE) 40 MG  tablet    Sig: 3 tablets a day for 5 days, 2 tablets a day for 5 days then 1 tablet daily    Dispense:  45 tablet    Refill:  3    Orders for this encounter: Orders Placed This Encounter  Procedures  . Hemoglobin A1C    Impression: 1. Type 2 diabetes mellitus treated without insulin ([place]) Meds ordered this encounter  Medications  . metFORMIN (GLUCOPHAGE) 500 MG tablet    Sig: Take 1 tablet (500 mg total) by mouth 2 (two) times daily with a meal.    Dispense:  60 tablet    Refill:  2  . megestrol (MEGACE) 40 MG tablet    Sig: 3 tablets a day for 5 days, 2 tablets a day for 5 days then 1 tablet daily    Dispense:  45 tablet    Refill:  3    - Hemoglobin A1C  2. PCOS (polycystic ovarian syndrome) Megestrol for bleeding management - Hemoglobin A1C   Plan: Check A1C, begin metformin, megestrol therapy for bleeding management  Follow Up: Return in about 6 weeks (around 01/19/2020) for Follow up, with Dr Elonda Husky.       Face to face time:  20 minutes  Greater than 50% of the visit time  was spent in counseling and coordination of care with the patient.  The summary and outline of the counseling and care coordination is summarized in the note above.   All questions were answered.  Past Medical History:  Diagnosis Date  . BV (bacterial vaginosis)   . Kidney stones   . Kidney stones     Past Surgical History:  Procedure Laterality Date  . none      OB History    Gravida  0   Para  0   Term  0   Preterm  0   AB  0   Living  0     SAB  0   TAB  0   Ectopic  0   Multiple  0   Live Births  0           Allergies  Allergen Reactions  . Penicillins Rash    Has patient had a PCN reaction causing immediate rash, facial/tongue/throat swelling, SOB or lightheadedness with hypotension: No Has patient had a PCN reaction causing severe rash involving mucus membranes or skin necrosis: No Has patient had a PCN reaction that required hospitalization:  No Has patient had a PCN reaction occurring within the last 10 years: No If all of the above answers are "NO", then may proceed with Cephalosporin use.     Social History   Socioeconomic History  . Marital status: Married    Spouse name: Not on file  . Number of children: Not on file  . Years of education: Not on file  . Highest education level: Not on file  Occupational History  . Not on file  Tobacco Use  . Smoking status: Never Smoker  . Smokeless tobacco: Never Used  Vaping Use  . Vaping Use: Never used  Substance and Sexual Activity  . Alcohol use: No    Alcohol/week: 0.0 standard drinks  . Drug use: No  . Sexual activity: Yes    Birth control/protection: None  Other Topics Concern  . Not on file  Social History Narrative  . Not on file   Social Determinants of Health   Financial Resource Strain: Medium Risk  . Difficulty of Paying Living Expenses: Somewhat hard  Food Insecurity: Food Insecurity Present  . Worried About Programme researcher, broadcasting/film/video in the Last Year: Sometimes true  . Ran Out of Food in the Last Year: Sometimes true  Transportation Needs: No Transportation Needs  . Lack of Transportation (Medical): No  . Lack of Transportation (Non-Medical): No  Physical Activity: Sufficiently Active  . Days of Exercise per Week: 5 days  . Minutes of Exercise per Session: 40 min  Stress: Stress Concern Present  . Feeling of Stress : To some extent  Social Connections: Moderately Isolated  . Frequency of Communication with Friends and Family: More than three times a week  . Frequency of Social Gatherings with Friends and Family: Never  . Attends Religious Services: Never  . Active Member of Clubs or Organizations: No  . Attends Banker Meetings: Never  . Marital Status: Married    Family History  Problem Relation Age of Onset  . Cancer Mother        colon, ovarian  . Hypertension Father

## 2019-12-09 LAB — HEMOGLOBIN A1C
Est. average glucose Bld gHb Est-mCnc: 240 mg/dL
Hgb A1c MFr Bld: 10 % — ABNORMAL HIGH (ref 4.8–5.6)

## 2019-12-12 ENCOUNTER — Telehealth: Payer: Self-pay | Admitting: Women's Health

## 2019-12-12 NOTE — Telephone Encounter (Signed)
Pt is calling to get results of Labs done THurs, cant get into her My Chart

## 2019-12-12 NOTE — Telephone Encounter (Signed)
Telephoned patient at home number and voicemail box not set up.  

## 2019-12-12 NOTE — Telephone Encounter (Signed)
Just the elevated A1C which is expected  Just keep her appointment in 6 weeks  Otherwise everything was as expected

## 2019-12-13 ENCOUNTER — Encounter: Payer: Self-pay | Admitting: Obstetrics & Gynecology

## 2019-12-20 ENCOUNTER — Telehealth: Payer: Self-pay | Admitting: *Deleted

## 2019-12-20 NOTE — Telephone Encounter (Signed)
Telephoned patient at home number and voicemail box not set up.  

## 2019-12-20 NOTE — Telephone Encounter (Signed)
Patient returned call. Advised patient of A1C results and to keep appointment scheduled per Dr. Despina Hidden. Patient voiced understanding.

## 2020-01-20 ENCOUNTER — Encounter: Payer: Self-pay | Admitting: Obstetrics & Gynecology

## 2020-01-20 ENCOUNTER — Ambulatory Visit (INDEPENDENT_AMBULATORY_CARE_PROVIDER_SITE_OTHER): Payer: 59 | Admitting: Obstetrics & Gynecology

## 2020-01-20 VITALS — BP 132/92 | HR 63 | Ht 61.5 in | Wt 209.0 lb

## 2020-01-20 DIAGNOSIS — E282 Polycystic ovarian syndrome: Secondary | ICD-10-CM | POA: Diagnosis not present

## 2020-01-20 MED ORDER — NORGESTIMATE-ETH ESTRADIOL 0.25-35 MG-MCG PO TABS: 1.0000 | ORAL_TABLET | Freq: Every day | ORAL | 11 refills | Status: AC

## 2020-01-20 NOTE — Progress Notes (Signed)
Patient ID: Karen Padilla, female   DOB: June 10, 1996, 24 y.o.   MRN: [id number]      Chief Complaint  Patient presents with  . Follow-up    on PCOS; pt feels better!!!       24 y.o. G0P0000 No LMP recorded. (Menstrual status: Other). The current method of family planning is megestrol.  Outpatient Encounter Medications as of 01/20/2020  Medication Sig  . megestrol (MEGACE) 40 MG tablet 3 tablets a day for 5 days, 2 tablets a day for 5 days then 1 tablet daily  . metFORMIN (GLUCOPHAGE) 500 MG tablet Take 1 tablet (500 mg total) by mouth 2 (two) times daily with a meal.  . norgestimate-ethinyl estradiol (ORTHO-CYCLEN) 0.25-35 MG-MCG tablet Take 1 tablet by mouth daily.   No facility-administered encounter medications on file as of 01/20/2020.    Subjective Pt did weel with her bleeding on megestrol Spotted a little the second week  Will now stop and transition for chronic PCOS associated DUB Past Medical History:  Diagnosis Date  . BV (bacterial vaginosis)   . Kidney stones   . Kidney stones     Past Surgical History:  Procedure Laterality Date  . none      OB History    Gravida  0   Para  0   Term  0   Preterm  0   AB  0   Living  0     SAB  0   TAB  0   Ectopic  0   Multiple  0   Live Births  0           Allergies  Allergen Reactions  . Penicillins Rash    Has patient had a PCN reaction causing immediate rash, facial/tongue/throat swelling, SOB or lightheadedness with hypotension: No Has patient had a PCN reaction causing severe rash involving mucus membranes or skin necrosis: No Has patient had a PCN reaction that required hospitalization: No Has patient had a PCN reaction occurring within the last 10 years: No If all of the above answers are "NO", then may proceed with Cephalosporin use.     Social History   Socioeconomic History  . Marital status: Married    Spouse name: Not on file  . Number of children: Not on file  . Years of  education: Not on file  . Highest education level: Not on file  Occupational History  . Not on file  Tobacco Use  . Smoking status: Never Smoker  . Smokeless tobacco: Never Used  Vaping Use  . Vaping Use: Never used  Substance and Sexual Activity  . Alcohol use: No    Alcohol/week: 0.0 standard drinks  . Drug use: No  . Sexual activity: Yes    Birth control/protection: None  Other Topics Concern  . Not on file  Social History Narrative  . Not on file   Social Determinants of Health   Financial Resource Strain: Medium Risk  . Difficulty of Paying Living Expenses: Somewhat hard  Food Insecurity: Food Insecurity Present  . Worried About Programme researcher, broadcasting/film/video in the Last Year: Sometimes true  . Ran Out of Food in the Last Year: Sometimes true  Transportation Needs: No Transportation Needs  . Lack of Transportation (Medical): No  . Lack of Transportation (Non-Medical): No  Physical Activity: Sufficiently Active  . Days of Exercise per Week: 5 days  . Minutes of Exercise per Session: 40 min  Stress: Stress Concern Present  . Feeling  of Stress : To some extent  Social Connections: Moderately Isolated  . Frequency of Communication with Friends and Family: More than three times a week  . Frequency of Social Gatherings with Friends and Family: Never  . Attends Religious Services: Never  . Active Member of Clubs or Organizations: No  . Attends Banker Meetings: Never  . Marital Status: Married    Family History  Problem Relation Age of Onset  . Cancer Mother        colon, ovarian  . Hypertension Father     Medications:       Current Outpatient Medications:  .  megestrol (MEGACE) 40 MG tablet, 3 tablets a day for 5 days, 2 tablets a day for 5 days then 1 tablet daily, Disp: 45 tablet, Rfl: 3 .  metFORMIN (GLUCOPHAGE) 500 MG tablet, Take 1 tablet (500 mg total) by mouth 2 (two) times daily with a meal., Disp: 60 tablet, Rfl: 2 .  norgestimate-ethinyl estradiol  (ORTHO-CYCLEN) 0.25-35 MG-MCG tablet, Take 1 tablet by mouth daily., Disp: 28 tablet, Rfl: 11  Objective Blood pressure (!) 132/92, pulse 63, height 5' 1.5" (1.562 m), weight 209 lb (94.8 kg).  Gen WDWN NAD  Pertinent ROS No burning with urination, frequency or urgency No nausea, vomiting or diarrhea Nor fever chills or other constitutional symptoms   Labs or studies See sonogram    Impression Diagnoses this Encounter::   ICD-10-CM   1. PCOS (polycystic ovarian syndrome)  E28.2    DUB managed successfully with megestrol.  will stop megestrol off everything for 1 week to withdraw the endometrium then start norgestimate 30 mic OCP    Established relevant diagnosis(es):   Plan/Recommendations: Meds ordered this encounter  Medications  . norgestimate-ethinyl estradiol (ORTHO-CYCLEN) 0.25-35 MG-MCG tablet    Sig: Take 1 tablet by mouth daily.    Dispense:  28 tablet    Refill:  11    Labs or Scans Ordered: No orders of the defined types were placed in this encounter.   Management:: See above  Follow up No follow-ups on file.        Face to face time:  10 minutes  Greater than 50% of the visit time was spent in counseling and coordination of care with the patient.  The summary and outline of the counseling and care coordination is summarized in the note above.   All questions were answered.

## 2020-02-10 ENCOUNTER — Other Ambulatory Visit: Payer: Self-pay | Admitting: Obstetrics & Gynecology

## 2020-02-10 ENCOUNTER — Telehealth: Payer: Self-pay | Admitting: Obstetrics & Gynecology

## 2020-02-10 MED ORDER — KETOROLAC TROMETHAMINE 10 MG PO TABS
10.0000 mg | ORAL_TABLET | Freq: Three times a day (TID) | ORAL | 0 refills | Status: AC | PRN
Start: 2020-02-10 — End: ?

## 2020-02-10 NOTE — Telephone Encounter (Signed)
I sent in a script for toradol

## 2020-02-10 NOTE — Telephone Encounter (Signed)
No voice mail set up @ 12:45 pm. No other number available. JSY

## 2020-02-10 NOTE — Telephone Encounter (Signed)
Patient states she was recently taken off of a medicine to help regulate her menstrual and she states that she has had severe pain.

## 2020-02-10 NOTE — Telephone Encounter (Addendum)
Pt was put on Megace 2 months ago. Megace was stopped and then she was put on birth control. Pt started period 2 weeks ago. Pain was so bad, she almost went to the hospital. Pt is still having heavy bleeding and bad cramps. Pt is taking Midol for pain. I advised when you start a new birth control, you can have irregular periods. It's best to stay on new pill for 3-4 packs, to see how it's going to affect period.

## 2021-03-06 ENCOUNTER — Encounter (HOSPITAL_COMMUNITY): Payer: Self-pay

## 2021-03-06 ENCOUNTER — Other Ambulatory Visit: Payer: Self-pay

## 2021-03-06 ENCOUNTER — Emergency Department (HOSPITAL_COMMUNITY)
Admission: EM | Admit: 2021-03-06 | Discharge: 2021-03-06 | Disposition: A | Discharge status: Home / Self Care | Payer: Self-pay | Attending: Emergency Medicine | Admitting: Emergency Medicine

## 2021-03-06 ENCOUNTER — Emergency Department (HOSPITAL_COMMUNITY): Payer: Self-pay

## 2021-03-06 DIAGNOSIS — M7732 Calcaneal spur, left foot: Secondary | ICD-10-CM | POA: Insufficient documentation

## 2021-03-06 DIAGNOSIS — I10 Essential (primary) hypertension: Secondary | ICD-10-CM | POA: Insufficient documentation

## 2021-03-06 DIAGNOSIS — M7752 Other enthesopathy of left foot: Secondary | ICD-10-CM | POA: Insufficient documentation

## 2021-03-06 DIAGNOSIS — Z7984 Long term (current) use of oral hypoglycemic drugs: Secondary | ICD-10-CM | POA: Insufficient documentation

## 2021-03-06 DIAGNOSIS — E119 Type 2 diabetes mellitus without complications: Secondary | ICD-10-CM | POA: Insufficient documentation

## 2021-03-06 HISTORY — DX: Type 2 diabetes mellitus without complications: E11.9

## 2021-03-06 MED ORDER — IBUPROFEN 400 MG PO TABS
600.0000 mg | ORAL_TABLET | Freq: Once | ORAL | Status: AC
  Administered 2021-03-06: 600 mg via ORAL
  Filled 2021-03-06: qty 2

## 2021-03-06 MED ORDER — NAPROXEN 375 MG PO TABS: 375.0000 mg | ORAL_TABLET | Freq: Two times a day (BID) | ORAL | 0 refills | Status: AC

## 2021-03-06 NOTE — Discharge Instructions (Addendum)
Please refer to the attached instructions. Follow up with orthopedics as discussed.

## 2021-03-06 NOTE — ED Provider Notes (Signed)
[facility] EMERGENCY DEPARTMENT Provider Note   CSN: [id number] Arrival date & time: 03/06/21  1704     History Chief Complaint  Patient presents with   Ankle Pain    Karen Padilla is a 25 y.o. female.  Patient reports one week history of left ankle swelling and pain. No known injury. Increased discomfort with ambulation. No fevers or chills. No increased warmth or redness.  The history is provided by the patient. No language interpreter was used.  Ankle Pain Location:  Ankle Time since incident:  1 week Injury: no   Ankle location:  L ankle     Past Medical History:  Diagnosis Date   BV (bacterial vaginosis)    Diabetes mellitus without complication (HCC)    Kidney stones    Kidney stones     Patient Active Problem List   Diagnosis Date Noted   Abnormal uterine bleeding (AUB) 11/23/2019   Chronic hypertension 11/23/2019    Past Surgical History:  Procedure Laterality Date   none       OB History     Gravida  0   Para  0   Term  0   Preterm  0   AB  0   Living  0      SAB  0   IAB  0   Ectopic  0   Multiple  0   Live Births  0           Family History  Problem Relation Age of Onset   Cancer Mother        colon, ovarian   Hypertension Father     Social History   Tobacco Use   Smoking status: Never   Smokeless tobacco: Never  Vaping Use   Vaping Use: Never used  Substance Use Topics   Alcohol use: No    Alcohol/week: 0.0 standard drinks   Drug use: No    Home Medications Prior to Admission medications   Medication Sig Start Date End Date Taking? Authorizing Provider  ketorolac (TORADOL) 10 MG tablet Take 1 tablet (10 mg total) by mouth every 8 (eight) hours as needed. 02/10/20   Lazaro Arms, MD  megestrol (MEGACE) 40 MG tablet 3 tablets a day for 5 days, 2 tablets a day for 5 days then 1 tablet daily 12/08/19   Lazaro Arms, MD  metFORMIN (GLUCOPHAGE) 500 MG tablet Take 1 tablet (500 mg total) by mouth 2 (two)  times daily with a meal. 12/08/19   Lazaro Arms, MD  norgestimate-ethinyl estradiol (ORTHO-CYCLEN) 0.25-35 MG-MCG tablet Take 1 tablet by mouth daily. 01/20/20   Lazaro Arms, MD    Allergies    Penicillins  Review of Systems   Review of Systems  Musculoskeletal:  Positive for joint swelling.  All other systems reviewed and are negative.  Physical Exam Updated Vital Signs BP (!) 155/98 (BP Location: Right Arm)   Pulse 78   Temp 98.2 F (36.8 C)   Resp 18   Ht 5\' 1"  (1.549 m)   Wt 97.5 kg   SpO2 99%   BMI 40.62 kg/m   Physical Exam Constitutional:      Appearance: Normal appearance.  HENT:     Head: Normocephalic.     Nose: Nose normal.  Eyes:     Conjunctiva/sclera: Conjunctivae normal.  Cardiovascular:     Rate and Rhythm: Normal rate.  Pulmonary:     Effort: Pulmonary effort is normal.  Musculoskeletal:  General: Swelling and tenderness present. Normal range of motion.  Skin:    General: Skin is warm and dry.  Neurological:     Mental Status: She is alert and oriented to person, place, and time.  Psychiatric:        Behavior: Behavior normal.    ED Results / Procedures / Treatments   Labs (all labs ordered are listed, but only abnormal results are displayed) Labs Reviewed - No data to display  EKG None  Radiology DG Ankle Complete Left  Result Date: 03/06/2021 CLINICAL DATA:  Ankle pain and swelling for 1 week. No known injury. EXAM: LEFT ANKLE COMPLETE - 3+ VIEW COMPARISON:  None. FINDINGS: There is no evidence of fracture or dislocation. The ankle mortise is preserved. Intact talar dome. Plantar calcaneal spur and Achilles tendon enthesophyte. There is no evidence of arthropathy or other focal bone abnormality. Small ankle joint effusion with mild diffuse soft tissue edema. IMPRESSION: Small ankle joint effusion and mild diffuse soft tissue edema. No acute osseous abnormality. Electronically Signed   By: Narda Rutherford M.D.   On: 03/06/2021  18:47    Procedures Procedures   Medications Ordered in ED Medications - No data to display  ED Course  I have reviewed the triage vital signs and the nursing notes.  Pertinent labs & imaging results that were available during my care of the patient were reviewed by me and considered in my medical decision making (see chart for details).    MDM Rules/Calculators/A&P                           Patient X-Ray negative for obvious fracture or dislocation.  Pt advised to follow up with orthopedics. Patient given ace wrap and crutches in the ED. Conservative therapy recommended and discussed. Patient will be discharged home & is agreeable with above plan. Returns precautions discussed. Pt appears safe for discharge. Final Clinical Impression(s) / ED Diagnoses Final diagnoses:  Calcaneal spur of foot, left  Enthesopathy of left ankle    Rx / DC Orders ED Discharge Orders          Ordered    naproxen (NAPROSYN) 375 MG tablet  2 times daily        03/06/21 2046             Felicie Morn, NP 03/06/21 2049    Bethann Berkshire, MD 03/11/21 1145

## 2021-03-06 NOTE — ED Triage Notes (Signed)
Pt. States their left ankle is swollen and hurts. Pt. States this has been on going for about a week. Pt. Denies injuring it.
